# Patient Record
Sex: Female | Born: 1975
Health system: Southern US, Community
[De-identification: ages and names within clinical notes are randomized; demographics above are authoritative.]

## PROBLEM LIST (undated history)

## (undated) DIAGNOSIS — E079 Disorder of thyroid, unspecified: Secondary | ICD-10-CM

## (undated) DIAGNOSIS — T7840XA Allergy, unspecified, initial encounter: Secondary | ICD-10-CM

## (undated) DIAGNOSIS — F329 Major depressive disorder, single episode, unspecified: Secondary | ICD-10-CM

## (undated) DIAGNOSIS — F32A Depression, unspecified: Secondary | ICD-10-CM

## (undated) DIAGNOSIS — F419 Anxiety disorder, unspecified: Secondary | ICD-10-CM

## (undated) HISTORY — DX: Allergy, unspecified, initial encounter: T78.40XA

## (undated) HISTORY — DX: Disorder of thyroid, unspecified: E07.9

---

## 2007-12-22 ENCOUNTER — Other Ambulatory Visit: Admission: RE | Admit: 2007-12-22 | Discharge: 2007-12-22 | Payer: Self-pay | Admitting: Family Medicine

## 2009-02-14 ENCOUNTER — Other Ambulatory Visit: Admission: RE | Admit: 2009-02-14 | Discharge: 2009-02-14 | Payer: Self-pay | Admitting: Family Medicine

## 2009-10-25 ENCOUNTER — Ambulatory Visit: Payer: Self-pay | Admitting: Family Medicine

## 2009-10-25 DIAGNOSIS — F329 Major depressive disorder, single episode, unspecified: Secondary | ICD-10-CM | POA: Insufficient documentation

## 2009-10-25 DIAGNOSIS — E785 Hyperlipidemia, unspecified: Secondary | ICD-10-CM | POA: Insufficient documentation

## 2009-10-25 DIAGNOSIS — H1045 Other chronic allergic conjunctivitis: Secondary | ICD-10-CM | POA: Insufficient documentation

## 2009-10-25 DIAGNOSIS — E039 Hypothyroidism, unspecified: Secondary | ICD-10-CM | POA: Insufficient documentation

## 2009-11-12 DIAGNOSIS — L408 Other psoriasis: Secondary | ICD-10-CM | POA: Insufficient documentation

## 2009-11-12 DIAGNOSIS — E063 Autoimmune thyroiditis: Secondary | ICD-10-CM | POA: Insufficient documentation

## 2010-01-24 ENCOUNTER — Telehealth: Payer: Self-pay | Admitting: Family Medicine

## 2010-04-15 ENCOUNTER — Ambulatory Visit: Payer: Self-pay | Admitting: Family Medicine

## 2010-04-15 ENCOUNTER — Other Ambulatory Visit: Admission: RE | Admit: 2010-04-15 | Discharge: 2010-04-15 | Payer: Self-pay | Admitting: Family Medicine

## 2010-04-16 LAB — CONVERTED CEMR LAB
AST: 14 units/L (ref 0–37)
BUN: 15 mg/dL (ref 6–23)
CO2: 24 meq/L (ref 19–32)
Calcium: 9.4 mg/dL (ref 8.4–10.5)
Chloride: 105 meq/L (ref 96–112)
Cholesterol: 266 mg/dL — ABNORMAL HIGH (ref 0–200)
Creatinine, Ser: 0.66 mg/dL (ref 0.40–1.20)
Glucose, Bld: 87 mg/dL (ref 70–99)
HDL: 75 mg/dL (ref >39)
Total CHOL/HDL Ratio: 3.5
Triglycerides: 174 mg/dL — ABNORMAL HIGH (ref <150)

## 2010-08-20 NOTE — Assessment & Plan Note (Signed)
Summary: NOV: Allergic rhinitis, thyroid, etc   Vital Signs:  Patient profile:   35 year old female Height:      62 inches Weight:      258 pounds BMI:     47.36 O2 Sat:      98 % on Room air Temp:     98.7 degrees F oral Pulse rate:   92 / minute BP sitting:   124 / 87  (left arm) Cuff size:   large  Vitals Entered By: Kathlene November (October 25, 2009 10:32 AM)  O2 Flow:  Room air CC: Np- allergies- cough, head congestion, H/A, eyes watery, sneezing Is Patient Diabetic? No   CC:  Np- allergies- cough, head congestion, H/A, eyes watery, and sneezing.  History of Present Illness: Np- allergies- cough, head congestion, H/A, eyes watery, sneezing.     Hx of spring allergies.  Lasted a couple of months last year. Tried claritin D and didn't really work.  Now taking the zyrtec - D and feels helping some. Also taking mucinex DM.  Cough is productive in the AM.  NO ear pain.    Says she is overdue for a thyroid check. Used to see endocrinology but her insurance has changed.  Feels she is doing well and is asymptomatic at this time.   Habits & Providers  Alcohol-Tobacco-Diet     Alcohol drinks/day: 0     Tobacco Status: never  Exercise-Depression-Behavior     Does Patient Exercise: no     Have you felt down or hopeless? yes     STD Risk: never     Drug Use: no     Seat Belt Use: always  Current Medications (verified): 1)  Sertraline Hcl 50 Mg Tabs (Sertraline Hcl) .... Take 1 1/2 Tablets By Mouth Once A Day 2)  Synthroid 100 Mcg Tabs (Levothyroxine Sodium) .... Take One Tablet By Mouth Once A Day 3)  Ortho Tri-Cyclen (28) 0.18/0.215/0.25 Mg-35 Mcg Tabs (Norgestim-Eth Estrad Triphasic) .... Take One Tablet By Mouth Once A Day  Allergies (verified): No Known Drug Allergies  Comments:  Nurse/Medical Assistant: The patient's medications and allergies were reviewed with the patient and were updated in the Medication and Allergy Lists. 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   Never Smoked Alcohol use-no Drug use-no Regular exercise-no 2 caffeinated drinks per day.  Smoking Status:  never Does Patient Exercise:  no STD Risk:  never Drug Use:  no Seat Belt Use:  always  Review of Systems       No fever/sweats/weakness, unexplained weight loss/gain.  No vison changes.  No difficulty hearing/ringing in ears, + hay fever/allergies.  No chest pain/discomfort, palpitations.  No Br lump/nipple discharge.  + cough/wheeze.  No blood in BM, nausea/vomiting/diarrhea.  No nighttime urination, leaking urine, unusual vaginal bleeding, discharge (penis or vagina).  No muscle/joint pain. No rash, change in mole.  No HA, memory loss.  No anxiety, sleep d/o, depression.  No easy bruising/bleeding, unexplained lump   Physical Exam  General:  Well-developed,well-nourished,in no acute distress; alert,appropriate and cooperative throughout examination Head:  Normocephalic and atraumatic without obvious abnormalities. No apparent alopecia or balding. Eyes:  No corneal or conjunctival inflammation noted. EOMI. Perrla. Ears:  External ear exam shows no significant lesions or deformities.  Otoscopic examination  reveals clear canals, tympanic membranes are intact bilaterally without bulging, retraction, inflammation or discharge. Hearing is grossly normal bilaterally. Nose:  External nasal examination shows no deformity or inflammation. Nasal mucosa are pink and moist without lesions or exudates. Mouth:  Oral mucosa and oropharynx without lesions or exudates.  Teeth in good repair. Neck:  No deformities, masses, or tenderness noted. Chest Wall:  No deformities, masses, or tenderness noted. Lungs:  Normal respiratory effort, chest expands symmetrically. Lungs are clear to auscultation, no crackles or  wheezes. Heart:  Normal rate and regular rhythm. S1 and S2 normal without gallop, murmur, click, rub or other extra sounds. Skin:  no rashes.   Cervical Nodes:  No lymphadenopathy noted Psych:  Cognition and judgment appear intact. Alert and cooperative with normal attention span and concentration. No apparent delusions, illusions, hallucinations   Impression & Recommendations:  Problem # 1:  ALLERGIC CONJUNCTIVITIS (ICD-372.14) Dsicussed traetment with oral antihistamine and will add a nasal steroid. Instructed on spray use and correct technique.  .F/U in 3-4 weeks if not better.   Problem # 2:  UNSPECIFIED HYPOTHYROIDISM (ICD-244.9)  Due for thyroid recheck. Will check today. She ison branded synthroid.  Her updated medication list for this problem includes:    Synthroid 100 Mcg Tabs (Levothyroxine sodium) .Marland Kitchen... Take one tablet by mouth once a day  Orders: T-TSH ([id number])  Problem # 3:  HYPERLIPIDEMIA (ICD-272.4) Due to recheck as well.   Orders: T-Comprehensive Metabolic Panel [id number]) T-Lipid Profile [id number])  Complete Medication List: 1)  Sertraline Hcl 100 Mg Tabs (Sertraline hcl) .... Take 1 tablet by mouth once a day 2)  Synthroid 100 Mcg Tabs (Levothyroxine sodium) .... Take one tablet by mouth once a day 3)  Ortho Tri-cyclen (28) 0.18/0.215/0.25 Mg-35 Mcg Tabs (Norgestim-eth estrad triphasic) .... Take one tablet by mouth once a day 4)  Vectical 3 Mcg/gm Oint (Calcitriol) .... Apply bid  a day to affected area. 5)  Flonase 50 Mcg/act Susp (Fluticasone propionate) .... 2 sprays in each nostril once a day.  Patient Instructions: 1)  Call if not better in 2-3 weeks if your allergies are better.  Prescriptions: SERTRALINE HCL 100 MG TABS (SERTRALINE HCL) Take 1 tablet by mouth once a day  #30 x 3   Entered and Authorized by:   Nani Gasser MD   Signed by:   Nani Gasser MD on 10/25/2009   Method used:   [facility] to        [facility] [phone]* (retail)       [address] [place], [place]  [address]       Ph: [phone] or [phone]       Fax: [phone]   RxID:   [id number] FLONASE 50 MCG/ACT SUSP (FLUTICASONE PROPIONATE) 2 sprays in each nostril once a day.  #1 bottle x 2   Entered and Authorized by:   Nani Gasser MD   Signed by:   Nani Gasser MD on 10/25/2009   Method used:   Electronically to        [facility] [phone]* (retail)       [address] [place], [place]  [address]       Ph: [phone] or [phone]       Fax: [phone]   RxID:   [id number]   Appended Document: NOV: Allergic rhinitis, thyroid, etc   Appended Document: NOV: Allergic rhinitis, thyroid, etc  PAP Result Date:  02/14/2009 PAP Result:  normal PAP Next Due:  1 yr

## 2010-08-20 NOTE — Assessment & Plan Note (Signed)
Summary: CPE w/ pap   Vital Signs:  Patient profile:   35 year old female Height:      62 inches Weight:      256 pounds Pulse rate:   70 / minute BP sitting:   107 / 74  (right arm) Cuff size:   large  Vitals Entered By: Avon Gully CMA, Duncan Dull) (April 15, 2010 4:04 PM) CC: CPE and PAP   CC:  CPE and PAP.  History of Present Illness: Last pap was nromal.  Periods have been regular. Usually has diarrhea with her periods.  Occ wil notice some loose stools without her periods.  No blood in the stool.  Notices it may occur when she eats tomatoes. It always resolves after 1-2 days. No family hx of GI d/o.   Current Medications (verified): 1)  Sertraline Hcl 100 Mg Tabs (Sertraline Hcl) .... Take 1 Tablet By Mouth Once A Day 2)  Synthroid 100 Mcg Tabs (Levothyroxine Sodium) .... Take One Tablet By Mouth Once A Day 3)  Ortho Tri-Cyclen (28) 0.18/0.215/0.25 Mg-35 Mcg Tabs (Norgestim-Eth Estrad Triphasic) .... Take One Tablet By Mouth Once A Day 4)  Vectical 3 Mcg/gm Oint (Calcitriol) .... Apply Bid  A Day To Affected Area. 5)  Flonase 50 Mcg/act Susp (Fluticasone Propionate) .... 2 Sprays in Each Nostril Once A Day. 6)  Clobetasol Propionate 0.05 % Crea (Clobetasol Propionate) 7)  Desonide 0.05 % Crea (Desonide) .... Apply To Affected Area  Allergies (verified): No Known Drug Allergies  Comments:  Nurse/Medical Assistant: The patient's medications and allergies were reviewed with the patient and were updated in the Medication and Allergy Lists. 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   Never Smoked Alcohol use-no Drug use-no Regular exercise-no 2 caffeinated drinks per day.   Past Surgical  History: None  Contraindications/Deferment of Procedures/Staging:    Test/Procedure: FLU VAX    Reason for deferment: patient declined   Social History: Reviewed history from 10/25/2009 and no changes required. 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   Never Smoked Alcohol use-no Drug use-no Regular exercise-no 2 caffeinated drinks per day.   Review of Systems  The patient denies anorexia, fever, weight loss, weight gain, vision loss, decreased hearing, hoarseness, chest pain, syncope, dyspnea on exertion, peripheral edema, prolonged cough, headaches, hemoptysis, abdominal pain, melena, hematochezia, severe indigestion/heartburn, hematuria, incontinence, genital sores, muscle weakness, suspicious skin lesions, transient blindness, difficulty walking, depression, unusual weight change, abnormal bleeding, enlarged lymph nodes, and breast masses.    Physical Exam  General:  Well-developed,well-nourished,in no acute distress; alert,appropriate and cooperative throughout examination Head:  Normocephalic and atraumatic without obvious abnormalities. No apparent alopecia or balding. Eyes:  No corneal or conjunctival inflammation noted. EOMI. Perrla. Ears:  External ear exam shows no significant lesions or deformities.  Otoscopic examination reveals clear canals, tympanic membranes are intact bilaterally without bulging, retraction, inflammation or discharge. Hearing is grossly normal bilaterally. Nose:  External nasal examination shows no deformity or inflammation. Nasal mucosa are pink and moist without lesions or exudates. Mouth:  Oral mucosa and oropharynx without lesions or exudates.  Teeth in good repair. Neck:  No deformities, masses, or tenderness noted. Chest Wall:  No deformities,  masses, or tenderness noted. Breasts:  No mass, nodules, thickening, tenderness, bulging, retraction, inflamation, nipple discharge or skin changes noted.   Lungs:   Normal respiratory effort, chest expands symmetrically. Lungs are clear to auscultation, no crackles or wheezes. Heart:  Normal rate and regular rhythm. S1 and S2 normal without gallop, murmur, click, rub or other extra sounds. Abdomen:  Bowel sounds positive,abdomen soft and non-tender without masses, organomegaly or hernias noted. Genitalia:  Normal introitus for age, no external lesions, no vaginal discharge, mucosa pink and moist, no vaginal or cervical lesions, no vaginal atrophy, no friaility or hemorrhage, normal uterus size and position, no adnexal masses or tenderness Msk:  No deformity or scoliosis noted of thoracic or lumbar spine.   Pulses:  R and L carotid,radial,dorsalis pedis and posterior tibial pulses are full and equal bilaterally Extremities:  No clubbing, cyanosis, edema, or deformity noted with normal full range of motion of all joints.   Neurologic:  No cranial nerve deficits noted. Station and gait are normal.  Sensory, motor and coordinative functions appear intact. Skin:  no rashes.   Cervical Nodes:  No lymphadenopathy noted Axillary Nodes:  No palpable lymphadenopathy Psych:  Cognition and judgment appear intact. Alert and cooperative with normal attention span and concentration. No apparent delusions, illusions, hallucinations   Impression & Recommendations:  Problem # 1:  ROUTINE GYNECOLOGICAL EXAMINATION (ICD-V72.31) Exam is normal F/U pap results.  Gave reassurnce about stools. Avoid tomatoes if a trigger. If blood in stool or sxs worsens then call as may need GI referrral but ai don't here anything worisome by her history and no sxs consistant with infection Encouraged regular exercise and calcium intake.  Encourage TDAP but she wanted to hold off.   Complete Medication List: 1)  Sertraline Hcl 100 Mg Tabs (Sertraline hcl) .... Take 1 tablet by mouth once a day 2)  Synthroid 100 Mcg Tabs (Levothyroxine sodium) .... Take one tablet by mouth once a day 3)   Ortho Tri-cyclen (28) 0.18/0.215/0.25 Mg-35 Mcg Tabs (Norgestim-eth estrad triphasic) .... Take one tablet by mouth once a day 4)  Vectical 3 Mcg/gm Oint (Calcitriol) .... Apply bid  a day to affected area. 5)  Flonase 50 Mcg/act Susp (Fluticasone propionate) .... 2 sprays in each nostril once a day. 6)  Clobetasol Propionate 0.05 % Crea (Clobetasol propionate) .... Apply daily to affected area. 7)  Desonide 0.05 % Crea (Desonide) .... Apply to affected area 8)  Luxiq 0.12 % Foam (Betamethasone valerate) .... Apply daily to affected area on gthe scalp  Other Orders: T-TSH ([id number]) T-Comprehensive Metabolic Panel [id number] [id number]) T-Lipid Profile [id number])  Patient Instructions: 1)  We will call you with your lab results by the end of the week 2)  Think about getting your tetanus vaccine.  Prescriptions: LUXIQ 0.12 % FOAM (BETAMETHASONE VALERATE) Apply daily to affected area on gthe scalp  #1 can x 1   Entered and Authorized by:   Nani Gasser MD   Signed by:   Nani Gasser MD on 04/15/2010   Method used:   [facility]ronically to        [facility] [phone]* (retail)       [address] [place], [place]  [address]       Ph: [phone] or [phone]       Fax: [phone]   RxID:   [id number] CLOBETASOL PROPIONATE 0.05 % CREA (CLOBETASOL PROPIONATE) Apply daily to affected area.  #40 grams. x 1  Entered and Authorized by:   Nani Gasser MD   Signed by:   Nani Gasser MD on 04/15/2010   Method used:   Electronically to        [facility] [phone]* (retail)       [address] [place], [place]  [address]       Ph: [phone] or [phone]       Fax: [phone]   RxID:   [id number] DESONIDE 0.05 % CREA (DESONIDE) apply to affected area  #40 gram. x 1   Entered and Authorized by:   Nani Gasser MD   Signed by:   Nani Gasser MD on 04/15/2010   Method used:   Electronically to        [facility] [phone]* (retail)        [address] [place], [place]  [address]       Ph: [phone] or [phone]       Fax: [phone]   RxID:   [id number] FLONASE 50 MCG/ACT SUSP (FLUTICASONE PROPIONATE) 2 sprays in each nostril once a day.  #1 bottle x 2   Entered and Authorized by:   Nani Gasser MD   Signed by:   Nani Gasser MD on 04/15/2010   Method used:   Electronically to        [facility] [phone]* (retail)       [address] [place], [place]  [address]       Ph: [phone] or [phone]       Fax: [phone]   RxID:   [id number] ORTHO TRI-CYCLEN (28) 0.18/0.215/0.25 MG-35 MCG TABS (NORGESTIM-ETH ESTRAD TRIPHASIC) Take one tablet by mouth once a day  #28 x 11   Entered and Authorized by:   Nani Gasser MD   Signed by:   Nani Gasser MD on 04/15/2010   Method used:   Electronically to        [facility] [phone]* (retail)       [address] [place], [place]  [address]       Ph: [phone] or [phone]       Fax: [phone]   RxID:   [id number]   Appended Document: CPE w/ pap Call pt: Pap smear normal- Return for  Pap in 3 years .  HPV neg.  [place]etheney MD, Santina Evans  04/22/2010 @ 4:49pm- Pt notified of results via VM. KJ LPN

## 2010-08-20 NOTE — Letter (Signed)
Summary: Records Dated 10-23-84 thru 06-13-09/[facility]  Records Dated 10-23-84 thru 06-13-09/[facility]   Imported By: Lanelle Bal 11/15/2009 12:15:38  _____________________________________________________________________  External Attachment:    Type:   Image     Comment:   External Document

## 2010-08-20 NOTE — Progress Notes (Signed)
Summary: Call A Nurse  Phone Note Call from Patient   Caller: Call A Nurse Summary of Call: [facility] Triage Call Report Triage Record Num: [id number] Operator: Tomasita Crumble Patient Name: [name] Call Date & Time: 01/19/2010 6:33:41PM Patient Phone: [phone] PCP: Nani Gasser Patient Gender: Female PCP Fax : [phone] Patient DOB: 11/02/1975 Practice Name: [facility] Reason for Call: Pt. calling. States she is 1-2 weeks before menses; cramping "like a period." Pain rated at 5/10. Onset 7/2 am. Clumpy/ cloudy urine noted. Afebrile/subjective. States pain constant x 3 or more hours; interferes w/ activity. Advised ED per Abdominal Pain protocol. Caller uncertain which facilty she will go to; reinfoeced need to go to ED. Protocol(s) Used: Abdominal Pain / Discomfort Recommended Outcome per Protocol: See ED Immediately Reason for Outcome: Abdominal pain that has steadily worsened over hours OR has been continuous for 3 hours or more AND any of the following: loss of appetite, vomiting starting after pain, any fever, OR unable to carry out normal activities Care Advice:  ~ 07 Initial call taken by: Payton Spark CMA,  January 24, 2010 8:32 AM     Appended Document: Call A Nurse Pls call and see if pt went to the ED.  Seymour Bars, D.O.  Appended Document: Call A Nurse [facility] for Pt to CB

## 2010-10-15 ENCOUNTER — Other Ambulatory Visit: Payer: Self-pay | Admitting: *Deleted

## 2010-10-15 DIAGNOSIS — E039 Hypothyroidism, unspecified: Secondary | ICD-10-CM

## 2010-10-15 MED ORDER — LEVOTHYROXINE SODIUM 112 MCG PO TABS: 112.0000 ug | ORAL_TABLET | Freq: Every day | ORAL | Status: DC

## 2010-10-30 ENCOUNTER — Telehealth: Payer: Self-pay | Admitting: *Deleted

## 2010-10-30 MED ORDER — FLUTICASONE FUROATE 27.5 MCG/SPRAY NA SUSP: 2.0000 | Freq: Every day | NASAL | Status: DC

## 2010-10-30 NOTE — Telephone Encounter (Signed)
We can try changing to veraymyst. I think we have coupons off the copay if she wants to pick one up.

## 2010-10-30 NOTE — Telephone Encounter (Signed)
Pt.notified

## 2010-10-30 NOTE — Telephone Encounter (Signed)
Pt called and states she has been using flonase but she thinks she needs something stronger because its not working for her.Pt also uses Zyrtec DIs there another nasal spray that may work better?.[facility]  [address] [place].

## 2010-11-01 ENCOUNTER — Encounter: Payer: Self-pay | Admitting: Family Medicine

## 2010-11-07 ENCOUNTER — Encounter: Payer: Self-pay | Admitting: Family Medicine

## 2010-11-08 ENCOUNTER — Encounter: Payer: Self-pay | Admitting: Family Medicine

## 2010-11-08 ENCOUNTER — Ambulatory Visit (INDEPENDENT_AMBULATORY_CARE_PROVIDER_SITE_OTHER): Payer: Private Health Insurance - Indemnity | Admitting: Family Medicine

## 2010-11-08 DIAGNOSIS — E785 Hyperlipidemia, unspecified: Secondary | ICD-10-CM

## 2010-11-08 DIAGNOSIS — E7889 Other lipoprotein metabolism disorders: Secondary | ICD-10-CM

## 2010-11-08 DIAGNOSIS — E789 Disorder of lipoprotein metabolism, unspecified: Secondary | ICD-10-CM

## 2010-11-08 DIAGNOSIS — E039 Hypothyroidism, unspecified: Secondary | ICD-10-CM

## 2010-11-08 MED ORDER — SERTRALINE HCL 100 MG PO TABS: ORAL_TABLET | ORAL | Status: DC

## 2010-11-08 NOTE — Patient Instructions (Addendum)
WE will call you with your lab results.

## 2010-11-08 NOTE — Assessment & Plan Note (Signed)
REviewd her last labs and gave her a copy. Due to recheck Discussed that we would not start a chol med but very important to get this down. Will refer for nutrition counseling.

## 2010-11-08 NOTE — Progress Notes (Signed)
  Subjective:    Patient ID: Monique Austin, female    DOB: 03-05-1976, 35 y.o.   MRN: [id number]  HPI  She is her to f/u her thyroid. WE adjusted her dose last time.  She denies any sx of skin, hair changes or sweats or chills or fatigue. She is struggling with eating a healthy diet and getting regular exercise She would like to see a nutritionist.   Review of Systems     Objective:   Physical Exam  Constitutional: She is oriented to person, place, and time. She appears well-developed and well-nourished.  HENT:  Head: Normocephalic.  Neck: No thyromegaly present.  Cardiovascular: Normal rate, regular rhythm and normal heart sounds.   Pulmonary/Chest: Effort normal and breath sounds normal.  Neurological: She is alert and oriented to person, place, and time.  Skin: Skin is warm and dry.  Psychiatric: She has a normal mood and affect.          Assessment & Plan:

## 2010-11-08 NOTE — Assessment & Plan Note (Signed)
Asymptomatic. Due to recheck levels.

## 2010-12-11 ENCOUNTER — Other Ambulatory Visit: Payer: Self-pay | Admitting: Family Medicine

## 2011-02-12 ENCOUNTER — Ambulatory Visit: Payer: Private Health Insurance - Indemnity | Admitting: *Deleted

## 2011-04-08 ENCOUNTER — Other Ambulatory Visit: Payer: Self-pay | Admitting: Family Medicine

## 2011-04-08 MED ORDER — NORGESTIM-ETH ESTRAD TRIPHASIC 0.18/0.215/0.25 MG-35 MCG PO TABS: 1.0000 | ORAL_TABLET | Freq: Every day | ORAL | Status: DC

## 2011-04-08 NOTE — Telephone Encounter (Signed)
Pt called for refill of her orthotricyclen medication. Plan:  Reviewed the pt chart and she is due this month for CPE/PAP.  LMOM for the pt telling her that a 30 day supply of her med will be sent but she has to get in before any more refills will be sent after today. Jarvis Newcomer, LPN [facility] Dimes

## 2011-04-15 ENCOUNTER — Encounter: Payer: Self-pay | Admitting: Family Medicine

## 2011-04-21 ENCOUNTER — Telehealth: Payer: Self-pay | Admitting: Family Medicine

## 2011-04-21 ENCOUNTER — Encounter: Payer: Self-pay | Admitting: Family Medicine

## 2011-04-21 ENCOUNTER — Ambulatory Visit (INDEPENDENT_AMBULATORY_CARE_PROVIDER_SITE_OTHER): Payer: Managed Care, Other (non HMO) | Admitting: Family Medicine

## 2011-04-21 VITALS — BP 124/78 | HR 85 | Wt 262.0 lb

## 2011-04-21 DIAGNOSIS — R0789 Other chest pain: Secondary | ICD-10-CM

## 2011-04-21 DIAGNOSIS — F411 Generalized anxiety disorder: Secondary | ICD-10-CM

## 2011-04-21 DIAGNOSIS — F419 Anxiety disorder, unspecified: Secondary | ICD-10-CM

## 2011-04-21 DIAGNOSIS — R071 Chest pain on breathing: Secondary | ICD-10-CM

## 2011-04-21 MED ORDER — FLUOXETINE HCL (PMDD) 20 MG PO CAPS: 20.0000 mg | ORAL_CAPSULE | Freq: Every day | ORAL | Status: DC

## 2011-04-21 NOTE — Patient Instructions (Addendum)
  Costochondritis (Costochondral Separation / Tietze Syndrome) Costochondritis (Tietze syndrome), or costochondral separation, is a swelling and irritation (inflammation) of the tissue (cartilage) that connects your ribs with your breastbone (sternum). It may occur on its own (spontaneously), through damage caused by an accident (trauma), or simply from coughing or minor exercise. It may take up to 6 weeks to get better and longer if you are unable to be conservative in your activities. HOME CARE INSTRUCTIONS  Avoid exhausting physical activity. Try not to strain your ribs during normal activity. This would include any activities using chest, belly (abdominal) and side muscles, especially if heavy weights are used.   Use ice for 5-10 minutes per hour while awake for the first 2 days. Place the ice in a plastic bag, and place a towel between the bag of ice and your skin.   Only take over-the-counter or prescription medicines for pain, discomfort, or fever as directed by your caregiver.  SEEK IMMEDIATE MEDICAL CARE IF:  Your pain increases or you are very uncomfortable.   An oral temperature above 101 develops.   You develop difficulty with your breathing.   You cough up blood.   You develop worse chest pains, shortness of breath, sweating, or vomiting.   You develop new, unexplained problems (symptoms).  MAKE SURE YOU:    Understand these instructions.   Will watch your condition.   Will get help right away if you are not doing well or get worse.  Document Released: 04/16/2005 Document Re-Released: 10/01/2009 [facility] Patient Information 2011 [place], [place].      Drop sertraline to 1 tab daily for 7 days then half tab daily for 7 days. Then quarter tab daily for 7 days. Once get to quarter tab then can start fluoxetine 20mg  daily.

## 2011-04-21 NOTE — Progress Notes (Signed)
Subjective:    Patient ID: Monique Austin, female    DOB: [birth year], 35 y.o.   MRN: [id number]  HPI Starting last Thursday has had upper chest pain that rotates sides. Stopped her thyroid and OCP about a week ago because she ran out of money to get her meds. Pain on the right side of the neck. No neck pain. Restarted her meds on thurs or Friday. No known trauma or injury.  No inc pain with rotation of the neck  Or upper arm. Some mild diarrhea on and off. Some SOb when got upset but not persistant. Has Pain on the right side.  No cough.  She also reports that her anxiety has been out of control. She's been under a lot of stress the last couple weeks it. She says unfortunately these are things that will continue to persist for a while. She is also struggling financially which was reason she didn't get her medications filled. She says she actually increased her sertraline to 2 tabs instead of one and a half tab on her own. But she's not sure this really helped with her mood or not.   Review of Systems     BP 124/78  Pulse 85  Wt 262 lb (118.842 kg)    No Known Allergies  Past Medical History  Diagnosis Date  . Thyroid disease   . Allergy     History reviewed. No pertinent past surgical history.  History   Social History  . Marital Status: Single    Spouse Name: N/A    Number of Children: N/A  . Years of Education: N/A   Occupational History  . Not on file.   Social History Main Topics  . Smoking status: Never Smoker   . Smokeless tobacco: Not on file  . Alcohol Use: No  . Drug Use: No  . Sexually Active:    Other Topics Concern  . Not on file   Social History Narrative  . No narrative on file    Family History  Problem Relation Age of Onset  . Cancer Mother 56    postmenopausal breast   . Hyperlipidemia Father   . Hypertension Father     Ms. [name] does not currently have medications on file.  Objective:   Physical Exam  Constitutional: She  appears well-developed and well-nourished.  HENT:  Head: Normocephalic and atraumatic.  Neck: Neck supple. No thyromegaly present.  Cardiovascular: Normal rate, regular rhythm and normal heart sounds.        No carotid bruits.   Pulmonary/Chest: Effort normal and breath sounds normal.       Mildly tender over the right upper chest post right below her clavicle. She is to have a little bit of tenderness on the left side as well. No tenderness over the sternum.  Lymphadenopathy:    She has no cervical adenopathy.  Skin: Skin is warm and dry.  Psychiatric: She has a normal mood and affect. Her behavior is normal.          Assessment & Plan:  Costochondritis - Recommend NSAID.  Warm compresses/heating pad.  I gave her handout on some information about this. If she is not feeling better in about a week please give [place] a call. i did do an EKG just to rule out cardiac cause as she is here worried about her heart. She has had some shortness of breath it is only when she gets very anxious. No palpitations. Her EKG shows 80 beats per minute,  normal sinus rhythm, she does have inverted T waves in lead 3 and aVF. No other acute abnormalities. I gave her some reassurance. She also is low risk overall for heart disease, except for her cholesterol and obesity.  Anxiety-anxiety is out of control right now. Her GAD 7 score is 21. I would like to discontinue the sertraline which he has been on for years and change her to fluoxetine. Certainly the only medication she's ever taken for her mood. I feel it is not controlling her symptoms very well right now. We also discussed the possibility of leaving the sertraline and adding Wellbutrin but at this point I would like to try changing medications. I would weaning process for her.  I'll see her back in 5-6 weeks. She actually has a physical planned later this week I'll see her then as well.

## 2011-04-21 NOTE — Telephone Encounter (Signed)
Pt dropped by the office this PM to have form filled out by the provider for loss time of work due to chest pain, SOB, diarrhea.  Under lots of stress.  Pt has sched appt our office on Wednesday. Plan:  Pt sent to [facility] but pt came back stating UC said need to see our office.  Dr. Linford Arnold will see. Jarvis Newcomer, LPN [facility]

## 2011-04-22 ENCOUNTER — Encounter: Payer: Self-pay | Admitting: Family Medicine

## 2011-04-23 ENCOUNTER — Ambulatory Visit (INDEPENDENT_AMBULATORY_CARE_PROVIDER_SITE_OTHER): Payer: Managed Care, Other (non HMO) | Admitting: Family Medicine

## 2011-04-23 ENCOUNTER — Encounter: Payer: Self-pay | Admitting: Family Medicine

## 2011-04-23 VITALS — BP 135/75 | HR 78 | Ht 62.0 in | Wt 261.0 lb

## 2011-04-23 DIAGNOSIS — E039 Hypothyroidism, unspecified: Secondary | ICD-10-CM

## 2011-04-23 DIAGNOSIS — Z0000 Encounter for general adult medical examination without abnormal findings: Secondary | ICD-10-CM

## 2011-04-23 NOTE — Progress Notes (Signed)
  Subjective:     Elspeth [name] is a 35 y.o. female and is here for a comprehensive physical exam. The patient reports no problems.  History   Social History  . Marital Status: Single    Spouse Name: N/A    Number of Children: N/A  . Years of Education: N/A   Occupational History  . Not on file.   Social History Main Topics  . Smoking status: Never Smoker   . Smokeless tobacco: Not on file  . Alcohol Use: No  . Drug Use: No  . Sexually Active:    Other Topics Concern  . Not on file   Social History Narrative  . No narrative on file   Health Maintenance  Topic Date Due  . Influenza Vaccine  04/20/2012  . Pap Smear  04/15/2013  . Tetanus/tdap  04/22/2021    The following portions of the patient's history were reviewed and updated as appropriate: allergies, current medications, past family history, past medical history, past social history and past surgical history.  Review of Systems A comprehensive review of systems was negative.   Objective:    BP 135/75  Pulse 78  Ht 5\' 2"  (1.575 m)  Wt 261 lb (118.389 kg)  BMI 47.74 kg/m2  SpO2 99%  LMP 04/02/2011 General appearance: alert, cooperative, appears stated age and mildly obese Head: Normocephalic, without obvious abnormality, atraumatic Eyes: Conjunctiva clear, extraocular movements intact, pupils equal round reactive to light and accommodation. Ears: normal TM's and external ear canals both ears Nose: Nares normal. Septum midline. Mucosa normal. No drainage or sinus tenderness. Throat: lips, mucosa, and tongue normal; teeth and gums normal Neck: no adenopathy, no carotid bruit, supple, symmetrical, trachea midline and thyroid not enlarged, symmetric, no tenderness/mass/nodules Back: symmetric, no curvature. ROM normal. No CVA tenderness. Lungs: clear to auscultation bilaterally Breasts: normal appearance, no masses or tenderness Heart: regular rate and rhythm, S1, S2 normal, no murmur, click, rub or  gallop Abdomen: soft, non-tender; bowel sounds normal; no masses,  no organomegaly Pelvic: cervix normal in appearance, external genitalia normal, no adnexal masses or tenderness, no cervical motion tenderness, rectovaginal septum normal, uterus normal size, shape, and consistency and vagina normal without discharge Extremities: extremities normal, atraumatic, no cyanosis or edema Pulses: 2+ and symmetric Skin: Skin color, texture, turgor normal. No rashes or lesions Lymph nodes: Cervical, supraclavicular, and axillary nodes normal. Neurologic: Grossly normal    Assessment:    Healthy female exam.   Plan:     See After Visit Summary for Counseling Recommendations  Start a regular exercise program and make sure you are eating a healthy diet Try to eat 4 servings of dairy a day or take a calcium supplement (500mg  twice a day). Your vaccines are up to date.  Given lab slip.  We will call her with her Pap smear result. We did an HPV effect is normal we can repeat in 2-3 years.

## 2011-04-23 NOTE — Patient Instructions (Signed)
Start a regular exercise program and make sure you are eating a healthy diet Try to eat 4 servings of dairy a day or take a calcium supplement (500mg twice a day). Your vaccines are up to date.   

## 2011-04-24 ENCOUNTER — Other Ambulatory Visit (HOSPITAL_COMMUNITY)
Admission: RE | Admit: 2011-04-24 | Discharge: 2011-04-24 | Disposition: A | Discharge status: Home / Self Care | Payer: 59 | Source: Ambulatory Visit | Attending: Family Medicine | Admitting: Family Medicine

## 2011-04-24 DIAGNOSIS — Z01419 Encounter for gynecological examination (general) (routine) without abnormal findings: Secondary | ICD-10-CM | POA: Insufficient documentation

## 2011-04-24 NOTE — Progress Notes (Signed)
Addended by: Avon Gully C on: 04/24/2011 11:41 AM   Modules accepted: Orders

## 2011-05-15 ENCOUNTER — Other Ambulatory Visit: Payer: Self-pay | Admitting: Family Medicine

## 2011-05-26 ENCOUNTER — Other Ambulatory Visit: Payer: Self-pay | Admitting: Family Medicine

## 2011-05-28 ENCOUNTER — Ambulatory Visit: Payer: Managed Care, Other (non HMO) | Admitting: Family Medicine

## 2011-06-16 ENCOUNTER — Encounter: Payer: Self-pay | Admitting: Family Medicine

## 2011-06-16 ENCOUNTER — Ambulatory Visit (INDEPENDENT_AMBULATORY_CARE_PROVIDER_SITE_OTHER): Payer: Managed Care, Other (non HMO) | Admitting: Family Medicine

## 2011-06-16 VITALS — BP 113/76 | HR 78 | Wt 258.0 lb

## 2011-06-16 DIAGNOSIS — F411 Generalized anxiety disorder: Secondary | ICD-10-CM

## 2011-06-16 DIAGNOSIS — F419 Anxiety disorder, unspecified: Secondary | ICD-10-CM

## 2011-06-16 MED ORDER — NORGESTIM-ETH ESTRAD TRIPHASIC 0.18/0.215/0.25 MG-35 MCG PO TABS: 1.0000 | ORAL_TABLET | Freq: Every day | ORAL | Status: DC

## 2011-06-16 MED ORDER — FLUOXETINE HCL 40 MG PO CAPS: 40.0000 mg | ORAL_CAPSULE | Freq: Every day | ORAL | Status: DC

## 2011-06-16 MED ORDER — LEVOTHYROXINE SODIUM 112 MCG PO TABS: 112.0000 ug | ORAL_TABLET | Freq: Every day | ORAL | Status: DC

## 2011-06-16 NOTE — Progress Notes (Signed)
  Subjective:    Patient ID: Monique Austin, female    DOB: January 17, 1976, 35 y.o.   MRN: [id number]  HPI Here to f/u Anxiety. Says the medication is working some  But not nearly as well as the sertraline, but she was also on the max dose of hte medication. She also has a shopping addiction tht is causing some problems for her, financial and relationsihip. She is interested in counseling. She is tolerated Loxitane well without any side effects. She is not getting any regular exercise.  Review of Systems     Objective:   Physical Exam  Constitutional: She is oriented to person, place, and time. She appears well-developed and well-nourished.  HENT:  Head: Normocephalic and atraumatic.  Cardiovascular: Normal rate, regular rhythm and normal heart sounds.   Pulmonary/Chest: Effort normal and breath sounds normal.  Neurological: She is alert and oriented to person, place, and time.  Skin: Skin is warm and dry.  Psychiatric: She has a normal mood and affect. Her behavior is normal.          Assessment & Plan:  Anxiety - GAD-7 score of 21 today. Inc fluox to 40mg  and call in 2 weeks if wants to inc to 60mg  dose. F/u in 1 mo. be happy to refer her for counseling. I think it's fantastic that she wants to start to work with someone on her shopping addiction. If she can get this under control with certainly help her anxiety as well.

## 2011-06-17 ENCOUNTER — Other Ambulatory Visit: Payer: Self-pay | Admitting: *Deleted

## 2011-06-17 ENCOUNTER — Telehealth: Payer: Self-pay | Admitting: Family Medicine

## 2011-06-17 MED ORDER — NORGESTIM-ETH ESTRAD TRIPHASIC 0.18/0.215/0.25 MG-35 MCG PO TABS: 1.0000 | ORAL_TABLET | Freq: Every day | ORAL | Status: DC

## 2011-06-17 MED ORDER — LEVOTHYROXINE SODIUM 112 MCG PO TABS: 112.0000 ug | ORAL_TABLET | Freq: Every day | ORAL | Status: DC

## 2011-06-17 MED ORDER — FLUOXETINE HCL 40 MG PO CAPS: 40.0000 mg | ORAL_CAPSULE | Freq: Every day | ORAL | Status: DC

## 2011-06-17 MED ORDER — FLUTICASONE FUROATE 27.5 MCG/SPRAY NA SUSP: 2.0000 | Freq: Every day | NASAL | Status: DC

## 2011-06-17 NOTE — Progress Notes (Signed)
Addended by: Ellsworth Lennox on: 06/17/2011 04:59 PM   Modules accepted: Orders

## 2011-06-17 NOTE — Telephone Encounter (Signed)
Patient was seen yesterday and Dr. Linford Arnold wanted her to have 3 meds and they are not at her pharmacy. Will you please resend to [facility] [place] [address]

## 2011-07-01 ENCOUNTER — Encounter (HOSPITAL_COMMUNITY): Payer: Self-pay

## 2011-07-03 ENCOUNTER — Ambulatory Visit (HOSPITAL_COMMUNITY): Payer: 59 | Admitting: Psychology

## 2011-07-04 ENCOUNTER — Encounter: Payer: Self-pay | Admitting: Family Medicine

## 2011-07-04 ENCOUNTER — Ambulatory Visit (INDEPENDENT_AMBULATORY_CARE_PROVIDER_SITE_OTHER): Payer: 59 | Admitting: Family Medicine

## 2011-07-04 DIAGNOSIS — J069 Acute upper respiratory infection, unspecified: Secondary | ICD-10-CM

## 2011-07-04 NOTE — Progress Notes (Signed)
  Subjective:    Patient ID: Monique Austin, female    DOB: 06-27-1976, 35 y.o.   MRN: [id number]  HPI ST for 3 days.  Then woke up with phlegm in her throat. Runny nose and nasal congestion. + HA, aches and pain.  No fever.  Some mild ear pain. ST is better.  Using IBU and mucinex. Using netti-pot. Has missed 2 days of work. Boyfriend is now getting sick.    Review of Systems     Objective:   Physical Exam  Constitutional: She is oriented to person, place, and time. She appears well-developed and well-nourished.  HENT:  Head: Normocephalic and atraumatic.  Right Ear: External ear normal.  Left Ear: External ear normal.  Nose: Nose normal.  Mouth/Throat: Oropharynx is clear and moist.       TMs and canals are clear. Nose is red and irritated.    Eyes: Conjunctivae and EOM are normal. Pupils are equal, round, and reactive to light.  Neck: Neck supple. No thyromegaly present.  Cardiovascular: Normal rate, regular rhythm and normal heart sounds.   Pulmonary/Chest: Effort normal and breath sounds normal. She has no wheezes.  Lymphadenopathy:    She has no cervical adenopathy.  Neurological: She is alert and oriented to person, place, and time.  Skin: Skin is warm and dry.  Psychiatric: She has a normal mood and affect.          Assessment & Plan:  URI - Symptomatic care.  Given H.O. On proven treatments.  Call if not better in one week. Work note given. Lung exam is nl. Gave reasurrance.

## 2011-07-04 NOTE — Patient Instructions (Signed)
Call if not better in one week.  

## 2011-07-09 ENCOUNTER — Encounter: Payer: Self-pay | Admitting: Family Medicine

## 2011-07-17 ENCOUNTER — Ambulatory Visit: Payer: Managed Care, Other (non HMO) | Admitting: Family Medicine

## 2011-08-14 ENCOUNTER — Other Ambulatory Visit: Payer: Self-pay | Admitting: Family Medicine

## 2011-09-15 ENCOUNTER — Other Ambulatory Visit: Payer: Self-pay | Admitting: *Deleted

## 2011-09-15 MED ORDER — FLUOXETINE HCL 40 MG PO CAPS: 40.0000 mg | ORAL_CAPSULE | Freq: Every day | ORAL | Status: DC

## 2011-10-08 ENCOUNTER — Other Ambulatory Visit: Payer: Self-pay | Admitting: Family Medicine

## 2011-10-09 ENCOUNTER — Ambulatory Visit (INDEPENDENT_AMBULATORY_CARE_PROVIDER_SITE_OTHER): Payer: Managed Care, Other (non HMO) | Admitting: Family Medicine

## 2011-10-09 ENCOUNTER — Encounter: Payer: Self-pay | Admitting: Family Medicine

## 2011-10-09 VITALS — BP 128/89 | HR 88 | Temp 98.4°F | Ht 62.0 in | Wt 256.0 lb

## 2011-10-09 DIAGNOSIS — H9209 Otalgia, unspecified ear: Secondary | ICD-10-CM

## 2011-10-09 DIAGNOSIS — J329 Chronic sinusitis, unspecified: Secondary | ICD-10-CM

## 2011-10-09 MED ORDER — AMOXICILLIN-POT CLAVULANATE 875-125 MG PO TABS: 1.0000 | ORAL_TABLET | Freq: Two times a day (BID) | ORAL | Status: AC

## 2011-10-09 MED ORDER — NORGESTIM-ETH ESTRAD TRIPHASIC 0.18/0.215/0.25 MG-35 MCG PO TABS: 1.0000 | ORAL_TABLET | Freq: Every day | ORAL | Status: DC

## 2011-10-09 NOTE — Patient Instructions (Signed)
Sinusitis Sinuses are air pockets within the bones of your face. The growth of bacteria within a sinus leads to infection. The infection prevents the sinuses from draining. This infection is called sinusitis. SYMPTOMS   There will be different areas of pain depending on which sinuses have become infected.  The maxillary sinuses often produce pain beneath the eyes.   Frontal sinusitis may cause pain in the middle of the forehead and above the eyes.  Other problems (symptoms) include:  Toothaches.   Colored, pus-like (purulent) drainage from the nose.   Swelling, warmth, and tenderness over the sinus areas may be signs of infection.  TREATMENT   Sinusitis is most often determined by an exam.X-rays may be taken. If x-rays have been taken, make sure you obtain your results or find out how you are to obtain them. Your caregiver may give you medications (antibiotics). These are medications that will help kill the bacteria causing the infection. You may also be given a medication (decongestant) that helps to reduce sinus swelling.   HOME CARE INSTRUCTIONS    Only take over-the-counter or prescription medicines for pain, discomfort, or fever as directed by your caregiver.   Drink extra fluids. Fluids help thin the mucus so your sinuses can drain more easily.   Applying either moist heat or ice packs to the sinus areas may help relieve discomfort.   Use saline nasal sprays to help moisten your sinuses. The sprays can be found at your local drugstore.  SEEK IMMEDIATE MEDICAL CARE IF:  You have a fever.   You have increasing pain, severe headaches, or toothache.   You have nausea, vomiting, or drowsiness.   You develop unusual swelling around the face or trouble seeing.  MAKE SURE YOU:    Understand these instructions.   Will watch your condition.   Will get help right away if you are not doing well or get worse.  Document Released: 07/07/2005 Document Revised: 06/26/2011 Document  Reviewed: 02/03/2007 [facility] Patient Information 2012 [place], [place].Otalgia The most common reason for this in children is an infection of the middle ear. Pain from the middle ear is usually caused by a build-up of fluid and pressure behind the eardrum. Pain from an earache can be sharp, dull, or burning. The pain may be temporary or constant. The middle ear is connected to the nasal passages by a short narrow tube called the Eustachian tube. The Eustachian tube allows fluid to drain out of the middle ear, and helps keep the pressure in your ear equalized. CAUSES   A cold or allergy can block the Eustachian tube with inflammation and the build-up of secretions. This is especially likely in small children, because their Eustachian tube is shorter and more horizontal. When the Eustachian tube closes, the normal flow of fluid from the middle ear is stopped. Fluid can accumulate and cause stuffiness, pain, hearing loss, and an ear infection if germs start growing in this area. SYMPTOMS   The symptoms of an ear infection may include fever, ear pain, fussiness, increased crying, and irritability. Many children will have temporary and minor hearing loss during and right after an ear infection. Permanent hearing loss is rare, but the risk increases the more infections a child has. Other causes of ear pain include retained water in the outer ear canal from swimming and bathing. Ear pain in adults is less likely to be from an ear infection. Ear pain may be referred from other locations. Referred pain may be from the joint between  your jaw and the skull. It may also come from a tooth problem or problems in the neck. Other causes of ear pain include:  A foreign body in the ear.   Outer ear infection.   Sinus infections.   Impacted ear wax.   Ear injury.   Arthritis of the jaw or TMJ problems.   Middle ear infection.   Tooth infections.   Sore throat with pain to the ears.  DIAGNOSIS   Your  caregiver can usually make the diagnosis by examining you. Sometimes other special studies, including x-rays and lab work may be necessary. TREATMENT    If antibiotics were prescribed, use them as directed and finish them even if you or your child's symptoms seem to be improved.   Sometimes PE tubes are needed in children. These are little plastic tubes which are put into the eardrum during a simple surgical procedure. They allow fluid to drain easier and allow the pressure in the middle ear to equalize. This helps relieve the ear pain caused by pressure changes.  HOME CARE INSTRUCTIONS    Only take over-the-counter or prescription medicines for pain, discomfort, or fever as directed by your caregiver. DO NOT GIVE CHILDREN ASPIRIN because of the association of Reye's Syndrome in children taking aspirin.   Use a cold pack applied to the outer ear for 15 to 20 minutes, 3 to 4 times per day or as needed may reduce pain. Do not apply ice directly to the skin. You may cause frost bite.   Over-the-counter ear drops used as directed may be effective. Your caregiver may sometimes prescribe ear drops.   Resting in an upright position may help reduce pressure in the middle ear and relieve pain.   Ear pain caused by rapidly descending from high altitudes can be relieved by swallowing or chewing gum. Allowing infants to suck on a bottle during airplane travel can help.   Do not smoke in the house or near children. If you are unable to quit smoking, smoke outside.   Control allergies.  SEEK IMMEDIATE MEDICAL CARE IF:    You or your child are becoming sicker.   Pain or fever relief is not obtained with medicine.   You or your child's symptoms (pain, fever, or irritability) do not improve within 24 to 48 hours or as instructed.   Severe pain suddenly stops hurting. This may indicate a ruptured eardrum.   You or your children develop new problems such as severe headaches, stiff neck, difficulty  swallowing, or swelling of the face or around the ear.  Document Released: 02/22/2004 Document Revised: 06/26/2011 Document Reviewed: 06/28/2008 [facility] Patient Information 2012 [place], [place].

## 2011-10-09 NOTE — Progress Notes (Signed)
  Subjective:    Patient ID: Monique Austin, female    DOB: 01/15/1976, 36 y.o.   MRN: [id number]  HPI ST x 3 days. Nasal congestion.  Bilat ear and eye pain.  + HA.  Gets slightly better if takes Advil.  Runny nose.  Cough.  Yellow thick mucous.  Taking zyrtec -D.  Using her netti-pot.  No fever.  No GI sxs.  Nightsweats.     Review of Systems     Objective:   Physical Exam  Constitutional: She is oriented to person, place, and time. She appears well-developed and well-nourished.  HENT:  Head: Normocephalic and atraumatic.  Right Ear: External ear normal.  Left Ear: External ear normal.  Nose: Nose normal.  Mouth/Throat: Oropharynx is clear and moist.       TMs and canals are clear.   Eyes: Conjunctivae and EOM are normal. Pupils are equal, round, and reactive to light.  Neck: Neck supple. No thyromegaly present.  Cardiovascular: Normal rate, regular rhythm and normal heart sounds.   Pulmonary/Chest: Effort normal and breath sounds normal. She has no wheezes.  Lymphadenopathy:    She has no cervical adenopathy.  Neurological: She is alert and oriented to person, place, and time.  Skin: Skin is warm and dry.  Psychiatric: She has a normal mood and affect.          Assessment & Plan:  Sinusitis - likely viral but does have mildly erythematous right TM so will go ahead and hive her a rx for augmentin.  Can fill if ear gets worse. Sympomatic care with decongestants, netti=pot etc. H.O. Given.

## 2011-12-16 ENCOUNTER — Other Ambulatory Visit: Payer: Self-pay | Admitting: Family Medicine

## 2012-02-07 ENCOUNTER — Emergency Department
Admission: EM | Admit: 2012-02-07 | Discharge: 2012-02-07 | Disposition: A | Discharge status: Home / Self Care | Payer: Managed Care, Other (non HMO) | Source: Home / Self Care

## 2012-02-07 DIAGNOSIS — R109 Unspecified abdominal pain: Secondary | ICD-10-CM

## 2012-02-07 DIAGNOSIS — R112 Nausea with vomiting, unspecified: Secondary | ICD-10-CM

## 2012-02-07 DIAGNOSIS — K529 Noninfective gastroenteritis and colitis, unspecified: Secondary | ICD-10-CM

## 2012-02-07 DIAGNOSIS — K219 Gastro-esophageal reflux disease without esophagitis: Secondary | ICD-10-CM

## 2012-02-07 HISTORY — DX: Depression, unspecified: F32.A

## 2012-02-07 HISTORY — DX: Major depressive disorder, single episode, unspecified: F32.9

## 2012-02-07 HISTORY — DX: Anxiety disorder, unspecified: F41.9

## 2012-02-07 LAB — POCT URINALYSIS DIP (MANUAL ENTRY)
Glucose, UA: NEGATIVE
Spec Grav, UA: >=1.03 (ref 1.005–1.03)
Urobilinogen, UA: 0.2 (ref 0–1)

## 2012-02-07 MED ORDER — LANSOPRAZOLE 30 MG PO CPDR: 30.0000 mg | DELAYED_RELEASE_CAPSULE | Freq: Every day | ORAL | Status: DC

## 2012-02-07 NOTE — ED Notes (Signed)
Monique Austin complains of stomach cramps last night and nausea, vomiting and diarrhea this morning. Denies chills. She also complains of fever and sweats.

## 2012-02-07 NOTE — ED Provider Notes (Signed)
History     CSN: [id number]  Arrival date & time 02/07/12  4098   First MD Initiated Contact with Patient 02/07/12 1015      No chief complaint on file. HPI Comments: Patient presents today with chief complaint of nausea, vomiting, diarrhea x1 day. Patient states his symptoms began last night. Patient had one episode of nonbilious nonbloody emesis. Nausea resolved after this. Patient had 2 episodes of loose bowel movements. Bowel movements were nonbloody. Patient also reports a mild upper back pain associated symptoms. Patient states she's had recurrent episodes like this in the past. Back pain is usually relieved with gas-ex.  Of note patient recently finished up her menstrual cycle and has been taking high-dose ibuprofen. Patient satiated fatty meal, [employer], prior to onset of symptoms. Patient reports mild generalized abdominal pain approximately one hour prior to onset of symptoms. Patient is now currently asymptomatic.    Past Medical History  Diagnosis Date  . Thyroid disease   . Allergy     No past surgical history on file.  Family History  Problem Relation Age of Onset  . Cancer Mother 10    postmenopausal breast   . Hyperlipidemia Father   . Hypertension Father     History  Substance Use Topics  . Smoking status: Never Smoker   . Smokeless tobacco: Not on file  . Alcohol Use: No    OB History    Grav Para Term Preterm Abortions TAB SAB Ect Mult Living                  Review of Systems  All other systems reviewed and are negative.    Allergies  Review of patient's allergies indicates no known allergies.  Home Medications   Current Outpatient Rx  Name Route Sig Dispense Refill  . BETAMETHASONE VALERATE 0.12 % EX FOAM Topical Apply topically.      Marland Kitchen CLOBETASOL PROPIONATE 0.05 % EX CREA Topical Apply topically.      Marland Kitchen CLOBETASOL PROPIONATE E 0.05 % EX CREA  APPLY TWICE DAILY FOR 4 WEEKS, THEN STOP FOR 1 WEEK 60 g 1  . DESONIDE 0.05 % EX CREA  Topical Apply topically.      Marland Kitchen FLUOXETINE HCL 40 MG PO CAPS Oral Take 1 capsule (40 mg total) by mouth daily. 30 capsule 2  . FLUTICASONE FUROATE 27.5 MCG/SPRAY NA SUSP Nasal Place 2 sprays into the nose daily. 2 sprays in each nostril daily 10 g 12  . LEVOTHYROXINE SODIUM 112 MCG PO TABS Oral Take 1 tablet (112 mcg total) by mouth daily. 30 tablet 3  . NORGESTIM-ETH ESTRAD TRIPHASIC 0.18/0.215/0.25 MG-35 MCG PO TABS Oral Take 1 tablet by mouth daily. 28 tablet 6  . VECTICAL 3 MCG/GM EX OINT  APPLY TOPICALLY TO AFFECTED AREAS 1-2 TIMES DAILY 100 g 0    There were no vitals taken for this visit.  Physical Exam  Constitutional:       Alert, morbidly obese, no acute distress   HENT:  Head: Normocephalic and atraumatic.  Right Ear: External ear normal.  Left Ear: External ear normal.  Eyes: Conjunctivae are normal. Pupils are equal, round, and reactive to light.  Neck: Normal range of motion. Neck supple.  Cardiovascular: Normal rate and regular rhythm.   Pulmonary/Chest: Effort normal and breath sounds normal.  Abdominal: Soft. There is tenderness. There is guarding.       Obese abdomen    Musculoskeletal: Normal range of motion.  No lumbar or thoracic TTP  Neurological: She is alert.  Skin: Skin is warm.    ED Course  Procedures (including critical care time)  Labs Reviewed - No data to display No results found.   No diagnosis found.    MDM  Overall exam is very reassuring. Pt is currently asymptomatic.  Given history, I suspect this is a subacute on chronic issue with elements of untreated GERD,  ? Biliary colic, and obesity.  Will start pt on PPI.  Discussed NSAID avoidance. Use tylenol instead.  Discussed high fat and fast food avoidance.  Weight loss.  Plan to follow up with PCP in 1 week for a follow up appt.  RTC sooner if pt becomes symptomatic. Would perform CT Abd Pelvis w/ and w/o contrast as well as CBC, CMET, lipase (r/o panceatitis). Handout given.    Pt expressed understanding of plan.     The patient and/or caregiver has been counseled thoroughly with regard to treatment plan and/or medications prescribed including dosage, schedule, interactions, rationale for use, and possible side effects and they verbalize understanding. Diagnoses and expected course of recovery discussed and will return if not improved as expected or if the condition worsens. Patient and/or caregiver verbalized understanding.               Floydene Flock, MD 02/07/12 1102

## 2012-02-09 NOTE — ED Provider Notes (Signed)
Agree with exam, assessment, and plan.   Lattie Haw, MD 02/09/12 1415

## 2012-02-10 ENCOUNTER — Telehealth: Payer: Self-pay | Admitting: *Deleted

## 2012-02-23 ENCOUNTER — Other Ambulatory Visit: Payer: Self-pay | Admitting: Family Medicine

## 2012-02-23 ENCOUNTER — Other Ambulatory Visit: Payer: Self-pay | Admitting: *Deleted

## 2012-02-23 MED ORDER — FLUOXETINE HCL 40 MG PO CAPS: 40.0000 mg | ORAL_CAPSULE | Freq: Every day | ORAL | Status: DC

## 2012-04-10 ENCOUNTER — Other Ambulatory Visit: Payer: Self-pay | Admitting: Family Medicine

## 2012-05-18 ENCOUNTER — Other Ambulatory Visit: Payer: Self-pay | Admitting: Family Medicine

## 2012-05-27 ENCOUNTER — Other Ambulatory Visit: Payer: Self-pay | Admitting: Family Medicine

## 2012-05-28 ENCOUNTER — Other Ambulatory Visit: Payer: Self-pay | Admitting: Family Medicine

## 2012-07-09 ENCOUNTER — Other Ambulatory Visit: Payer: Self-pay | Admitting: Family Medicine

## 2012-07-25 ENCOUNTER — Emergency Department
Admission: EM | Admit: 2012-07-25 | Discharge: 2012-07-25 | Disposition: A | Discharge status: Home / Self Care | Payer: Managed Care, Other (non HMO) | Source: Home / Self Care | Attending: Emergency Medicine | Admitting: Emergency Medicine

## 2012-07-25 DIAGNOSIS — R509 Fever, unspecified: Secondary | ICD-10-CM

## 2012-07-25 DIAGNOSIS — J111 Influenza due to unidentified influenza virus with other respiratory manifestations: Secondary | ICD-10-CM

## 2012-07-25 DIAGNOSIS — R1110 Vomiting, unspecified: Secondary | ICD-10-CM

## 2012-07-25 LAB — POCT INFLUENZA A/B: Influenza B, POC: NEGATIVE

## 2012-07-25 MED ORDER — OSELTAMIVIR PHOSPHATE 75 MG PO CAPS: 75.0000 mg | ORAL_CAPSULE | Freq: Two times a day (BID) | ORAL | Status: DC

## 2012-07-25 MED ORDER — ONDANSETRON HCL 4 MG PO TABS: 4.0000 mg | ORAL_TABLET | Freq: Three times a day (TID) | ORAL | Status: DC | PRN

## 2012-07-25 MED ORDER — ONDANSETRON HCL 4 MG PO TABS: 4.0000 mg | ORAL_TABLET | Freq: Four times a day (QID) | ORAL | Status: DC

## 2012-07-25 NOTE — ED Provider Notes (Signed)
History     CSN: [id number]  Arrival date & time 07/25/12  1728   First MD Initiated Contact with Patient 07/25/12 1754      Chief Complaint  Patient presents with  . Fever    x 2 days  . Emesis    x 1 day    (Consider location/radiation/quality/duration/timing/severity/associated sxs/prior treatment) HPI Monique Austin is a 37 y.o. female who complains of onset of cold symptoms for < 24 hours.  The symptoms are constant and moderate in severity.  No flu shot this year.  Multiple people at her work have been diagnosed with the flu over the last few days.  Not taking any OTC medications. No sore throat + cough No pleuritic pain No wheezing + nasal congestion + post-nasal drainage No sinus pain/pressure No chest congestion No itchy/red eyes No earache No hemoptysis No SOB + chills/sweats + fever + nausea + vomiting No abdominal pain + diarrhea No skin rashes + fatigue + myalgias     Past Medical History  Diagnosis Date  . Thyroid disease   . Allergy   . Anxiety   . Depression     History reviewed. No pertinent past surgical history.  Family History  Problem Relation Age of Onset  . Cancer Mother 61    postmenopausal breast   . Hyperlipidemia Father   . Hypertension Father     History  Substance Use Topics  . Smoking status: Never Smoker   . Smokeless tobacco: Never Used  . Alcohol Use: No    OB History    Grav Para Term Preterm Abortions TAB SAB Ect Mult Living                  Review of Systems  All other systems reviewed and are negative.    Allergies  Review of patient's allergies indicates no known allergies.  Home Medications   Current Outpatient Rx  Name  Route  Sig  Dispense  Refill  . BETAMETHASONE VALERATE 0.12 % EX FOAM   Topical   Apply topically.           Marland Kitchen CLOBETASOL PROPIONATE 0.05 % EX CREA   Topical   Apply topically.           Marland Kitchen CLOBETASOL PROPIONATE E 0.05 % EX CREA      APPLY TWICE DAILY FOR 4 WEEKS, THEN  STOP FOR 1 WEEK   60 g   1   . DESONIDE 0.05 % EX CREA      APPLY TO AFFECTED AREA AS DIRECTED   60 g   0   . FLUOXETINE HCL 40 MG PO CAPS   Oral   Take 1 capsule (40 mg total) by mouth daily.   30 capsule   0   . FLUOXETINE HCL 40 MG PO CAPS      TAKE ONE CAPSULE EVERY DAY   30 capsule   0   . LANSOPRAZOLE 30 MG PO CPDR   Oral   Take 1 capsule (30 mg total) by mouth daily.   30 capsule   3   . LEVOTHYROXINE SODIUM 112 MCG PO TABS      TAKE 1 TABLET EVERY DAY   30 tablet   1   . NORGESTIM-ETH ESTRAD TRIPHASIC 0.18/0.215/0.25 MG-35 MCG PO TABS   Oral   Take 1 tablet by mouth daily.   28 tablet   6   . VECTICAL 3 MCG/GM EX OINT      APPLY TOPICALLY  TO AFFECTED AREAS 1-2 TIMES DAILY   100 g   0   . FLUTICASONE FUROATE 27.5 MCG/SPRAY NA SUSP   Nasal   Place 2 sprays into the nose daily. 2 sprays in each nostril daily   10 g   12   . ONDANSETRON HCL 4 MG PO TABS   Oral   Take 1 tablet (4 mg total) by mouth every 8 (eight) hours as needed for nausea.   10 tablet   0   . ONDANSETRON HCL 4 MG PO TABS   Oral   Take 1 tablet (4 mg total) by mouth every 6 (six) hours.   12 tablet   0   . OSELTAMIVIR PHOSPHATE 75 MG PO CAPS   Oral   Take 1 capsule (75 mg total) by mouth 2 (two) times daily.   10 capsule   0   . OSELTAMIVIR PHOSPHATE 75 MG PO CAPS   Oral   Take 1 capsule (75 mg total) by mouth 2 (two) times daily.   10 capsule   0     BP 122/98  Pulse 118  Temp 101.7 F (38.7 C) (Oral)  Resp 18  Ht 5\' 2"  (1.575 m)  Wt 250 lb (113.399 kg)  BMI 45.73 kg/m2  SpO2 97%  Physical Exam  Nursing note and vitals reviewed. Constitutional: She is oriented to person, place, and time. She appears well-developed and well-nourished.  Non-toxic appearance. She appears ill.  HENT:  Head: Normocephalic and atraumatic.  Right Ear: Tympanic membrane, external ear and ear canal normal.  Left Ear: Tympanic membrane, external ear and ear canal normal.  Nose:  Mucosal edema and rhinorrhea present.  Mouth/Throat: Posterior oropharyngeal erythema present. No oropharyngeal exudate or posterior oropharyngeal edema.  Eyes: No scleral icterus.  Neck: Neck supple.  Cardiovascular: Regular rhythm and normal heart sounds.   Pulmonary/Chest: Effort normal and breath sounds normal. No respiratory distress. She has no decreased breath sounds. She has no wheezes. She has no rhonchi.  Neurological: She is alert and oriented to person, place, and time.  Skin: Skin is warm and dry. No rash noted.  Psychiatric: She has a normal mood and affect. Her speech is normal.    ED Course  Procedures (including critical care time)   Labs Reviewed  POCT INFLUENZA A/B - Normal   No results found.   1. Fever   2. Vomiting   3. Influenza-like illness       MDM  1)  This looks like influenza, but the rapid flu test is negative. I still think it's flu and will treat with Tamiflu since she is within 24 hours of onset + Zofran for her nausea.  Encourage hydration, rest. 2)  Use nasal saline solution (over the counter) at least 3 times a day. 3)  Can take tylenol every 6 hours or motrin every 8 hours for pain or fever. 4)  Follow up with your primary doctor if no improvement in 5-7 days, sooner if increasing pain, fever, or new symptoms.     Marlaine Hind, MD 07/25/12 [phone]

## 2012-07-25 NOTE — ED Notes (Signed)
Monique Austin complains of fever and chills starting yesterday. Today she has had a dry cough and vomiting.

## 2012-08-26 ENCOUNTER — Other Ambulatory Visit: Payer: Self-pay | Admitting: Family Medicine

## 2012-08-26 NOTE — Telephone Encounter (Signed)
What is this med? Is ok to fill

## 2012-09-09 ENCOUNTER — Other Ambulatory Visit: Payer: Self-pay | Admitting: Family Medicine

## 2012-09-10 ENCOUNTER — Encounter: Payer: Self-pay | Admitting: Physician Assistant

## 2012-09-10 ENCOUNTER — Ambulatory Visit (INDEPENDENT_AMBULATORY_CARE_PROVIDER_SITE_OTHER): Payer: Managed Care, Other (non HMO) | Admitting: Physician Assistant

## 2012-09-10 VITALS — BP 138/88 | HR 78 | Temp 98.3°F | Wt 257.0 lb

## 2012-09-10 DIAGNOSIS — H9209 Otalgia, unspecified ear: Secondary | ICD-10-CM

## 2012-09-10 DIAGNOSIS — H9201 Otalgia, right ear: Secondary | ICD-10-CM

## 2012-09-10 MED ORDER — ANTIPYRINE-BENZOCAINE 5.4-1.4 % OT SOLN: 3.0000 [drp] | OTIC | Status: DC | PRN

## 2012-09-10 MED ORDER — FLUTICASONE PROPIONATE 50 MCG/ACT NA SUSP: 2.0000 | Freq: Every day | NASAL | Status: DC

## 2012-09-10 MED ORDER — METHYLPREDNISOLONE (PAK) 4 MG PO TABS: 4.0000 mg | ORAL_TABLET | Freq: Every day | ORAL | Status: DC

## 2012-09-10 NOTE — Patient Instructions (Addendum)
Advil 800mg  every 8 hours.

## 2012-09-10 NOTE — Progress Notes (Signed)
  Subjective:    Patient ID: Monique Austin, female    DOB: [birth year], 37 y.o.   MRN: [id number]  Otalgia  There is pain in the right ear. This is a new problem. Episode onset: 3 days.  The problem occurs constantly. The problem has been gradually worsening. There has been no fever. The pain is at a severity of 5/10. The pain is moderate. Pertinent negatives include no abdominal pain, coughing, diarrhea, drainage, ear discharge, headaches, hearing loss, neck pain, rash, rhinorrhea, sore throat or vomiting. She has tried NSAIDs for the symptoms. The treatment provided mild relief. There is no history of a chronic ear infection or a tympanostomy tube.       Review of Systems  HENT: Positive for ear pain. Negative for hearing loss, sore throat, rhinorrhea, neck pain and ear discharge.   Respiratory: Negative for cough.   Gastrointestinal: Negative for vomiting, abdominal pain and diarrhea.  Skin: Negative for rash.  Neurological: Negative for headaches.       Objective:   Physical Exam  Constitutional: She is oriented to person, place, and time. She appears well-developed and well-nourished.  HENT:  Head: Normocephalic and atraumatic.  Right Ear: External ear normal.  Left Ear: External ear normal.  Mouth/Throat: Oropharynx is clear and moist. No oropharyngeal exudate.  Right TM slightly retracted at top of TM and air bubbles at base. Good light reflex.   Turbinates red and swollen bilaterally.  Negative for maxillary or frontal tenderness.  Eyes: Conjunctivae are normal. Right eye exhibits no discharge. Left eye exhibits no discharge.  Neck: Normal range of motion. Neck supple.  Cardiovascular: Normal rate, regular rhythm and normal heart sounds.   Pulmonary/Chest: Effort normal and breath sounds normal. She has no wheezes.  Lymphadenopathy:    She has no cervical adenopathy.  Neurological: She is alert and oriented to person, place, and time.  Skin: Skin is warm and dry.   Psychiatric: She has a normal mood and affect.          Assessment & Plan:  Right ear pain- discussed with patient no infection is present on exam. Seems like pain is due to pressure in inner ear or some eustachian tube disfunction. Start Medrol dose pak, flonase, decongestant. Can use auralgan for pain control. Call if not improving.

## 2012-11-07 ENCOUNTER — Other Ambulatory Visit: Payer: Self-pay | Admitting: Family Medicine

## 2012-11-12 ENCOUNTER — Other Ambulatory Visit: Payer: Self-pay | Admitting: Family Medicine

## 2012-11-29 ENCOUNTER — Telehealth: Payer: Self-pay | Admitting: *Deleted

## 2012-11-29 NOTE — Telephone Encounter (Signed)
Have her make appt

## 2012-11-29 NOTE — Telephone Encounter (Signed)
Pt calls and wants to know if you can give her something for the cramping she is having while on her period. States is taking 4 ibuprofen every 8 hours with no relief. Please advise

## 2012-11-30 NOTE — Telephone Encounter (Signed)
LMOM to call back and schedule appointment. Barry Dienes, LPN

## 2013-01-13 ENCOUNTER — Encounter: Payer: Self-pay | Admitting: *Deleted

## 2013-01-13 ENCOUNTER — Emergency Department
Admission: EM | Admit: 2013-01-13 | Discharge: 2013-01-13 | Disposition: A | Discharge status: Home / Self Care | Payer: Managed Care, Other (non HMO) | Source: Home / Self Care | Attending: Family Medicine | Admitting: Family Medicine

## 2013-01-13 DIAGNOSIS — F329 Major depressive disorder, single episode, unspecified: Secondary | ICD-10-CM

## 2013-01-13 MED ORDER — OLANZAPINE 2.5 MG PO TABS: 2.5000 mg | ORAL_TABLET | Freq: Every day | ORAL | Status: DC

## 2013-01-13 NOTE — ED Provider Notes (Signed)
History    CSN: [id number] Arrival date & time 01/13/13  1613  First MD Initiated Contact with Patient 01/13/13 1638     Chief Complaint  Patient presents with  . Depression  . Headache     HPI Comments: Patient has a long history of anxiety, presently taking Prozac 40mg  daily.  She had been on Zoloft in the past with poor response.  She admits that she has had an element of depression also, marginally controlled with Prozac.  She reports that she broke up with her boyfriend in October 2013.  They continued to live together, however, for financial convenience.  She had been generally emotionally stable until two weeks ago when she moved back to her own house. During the past week she has felt increasingly depressed, with frequent episodes of crying, difficulty concentrating, poor sleep, difficulty falling asleep, and early morning awakening.  She has had recurring headache, loose stools, and feels dizzy at times.  She has had occasional suicidal thoughts, but no plans.  The history is provided by the patient.   Past Medical History  Diagnosis Date  . Thyroid disease   . Allergy   . Anxiety   . Depression    History reviewed. No pertinent past surgical history. Family History  Problem Relation Age of Onset  . Cancer Mother 58    postmenopausal breast   . Hyperlipidemia Father   . Hypertension Father    History  Substance Use Topics  . Smoking status: Never Smoker   . Smokeless tobacco: Never Used  . Alcohol Use: No   OB History   Grav Para Term Preterm Abortions TAB SAB Ect Mult Living                 Review of Systems  Constitutional: Positive for activity change, appetite change and fatigue. Negative for fever and chills.  Eyes: Negative.   Respiratory: Positive for chest tightness. Negative for shortness of breath.   Cardiovascular: Negative.   Gastrointestinal: Positive for diarrhea. Negative for nausea, vomiting and abdominal pain.  Endocrine: Negative.    Genitourinary: Negative.   Musculoskeletal: Positive for arthralgias.  Skin: Negative.   Neurological: Positive for dizziness and headaches.  Psychiatric/Behavioral: Positive for suicidal ideas, sleep disturbance, dysphoric mood and decreased concentration. Negative for confusion, self-injury and agitation. The patient is nervous/anxious.     Allergies  Review of patient's allergies indicates no known allergies.  Home Medications   Current Outpatient Rx  Name  Route  Sig  Dispense  Refill  . antipyrine-benzocaine (AURALGAN) otic solution   Right Ear   Place 3 drops into the right ear every 2 (two) hours as needed for pain.   10 mL   0   . clobetasol (TEMOVATE) 0.05 % cream   Topical   Apply topically.           Marland Kitchen CLOBETASOL PROPIONATE E 0.05 % emollient cream      APPLY TO AFFECTED AREA TWICE A DAY X4 WEEKS THEN STOP X1 WEEK   60 g   0   . desonide (DESOWEN) 0.05 % cream      APPLY TO AFFECTED AREA AS DIRECTED   60 g   0   . FLUoxetine (PROZAC) 40 MG capsule      TAKE ONE CAPSULE BY MOUTH EVERY DAY   30 capsule   2   . fluticasone (FLONASE) 50 MCG/ACT nasal spray   Nasal   Place 2 sprays into the nose daily.  16 g   2   . levothyroxine (SYNTHROID, LEVOTHROID) 112 MCG tablet      TAKE 1 TABLET EVERY DAY   30 tablet   4   . methylPREDNIsolone (MEDROL DOSPACK) 4 MG tablet   Oral   Take 1 tablet (4 mg total) by mouth daily. follow package directions   21 tablet   0   . OLANZapine (ZYPREXA) 2.5 MG tablet   Oral   Take 1 tablet (2.5 mg total) by mouth at bedtime.   7 tablet   0   . VECTICAL 3 MCG/GM cream      APPLY TOPICALLY TO AFFECTED AREAS 1-2 TIMES DAILY   100 g   3    BP 137/97  Pulse 76  Temp(Src) 98.8 F (37.1 C) (Oral)  Resp 20  Ht 5\' 2"  (1.575 m)  Wt 257 lb 4 oz (116.688 kg)  BMI 47.04 kg/m2  SpO2 100% Physical Exam Nursing notes and Vital Signs reviewed. Appearance:  Patient appears stated age, and in no acute distress.   Patient is obese (BMI 47) Psychiatric:  Patient is alert and oriented with good eye contact.  Thoughts are organized.  No psychomotor retardation.  Memory intact.  Mood mildly depressed.  Affect flat.  Not suicidal  Eyes:  Pupils are equal, round, and reactive to light and accomodation.  Extraocular movement is intact.  Conjunctivae are not inflamed  Mouth/Pharynx:  Normal Neck:  Supple.   No adenopathy Lungs:  Clear to auscultation.  Breath sounds are equal.  Heart:  Regular rate and rhythm without murmurs, rubs, or gallops.  Abdomen:  Nontender without masses or hepatosplenomegaly.  Bowel sounds are present.  No CVA or flank tenderness.  Extremities:  No edema.   Skin:  No rash present.   Neurologic:  Cranial nerves 2 through 12 are normal.    ED Course  Procedures  none   1. DEPRESSION; decreased control     MDM   Continue Prozac.  Add low dose of mood stabilizer:  Zyprexa 2.5mg  at bedtime (Rx #7, 0 ref) Followup with PCP in about 6 days.  Red flags discussed. If symptoms become significantly worse during the night or over the weekend, proceed to the local emergency room.   Lattie Haw, MD 01/14/13 [phone]

## 2013-01-13 NOTE — ED Notes (Signed)
Patient c/o feeling depressed x 1 wk states "I am going through a break up and I have had thoughts of not living any more, I am not eating, have a headache, diarrhea, and dizzy."

## 2013-01-20 ENCOUNTER — Ambulatory Visit: Payer: Managed Care, Other (non HMO) | Admitting: Family Medicine

## 2013-01-20 DIAGNOSIS — Z0289 Encounter for other administrative examinations: Secondary | ICD-10-CM

## 2013-05-05 ENCOUNTER — Other Ambulatory Visit: Payer: Self-pay | Admitting: Family Medicine

## 2013-05-13 ENCOUNTER — Encounter: Payer: Self-pay | Admitting: Physician Assistant

## 2013-05-13 ENCOUNTER — Ambulatory Visit (INDEPENDENT_AMBULATORY_CARE_PROVIDER_SITE_OTHER): Payer: Managed Care, Other (non HMO) | Admitting: Physician Assistant

## 2013-05-13 VITALS — BP 140/86 | HR 87 | Wt 264.0 lb

## 2013-05-13 DIAGNOSIS — W57XXXA Bitten or stung by nonvenomous insect and other nonvenomous arthropods, initial encounter: Secondary | ICD-10-CM

## 2013-05-13 DIAGNOSIS — B009 Herpesviral infection, unspecified: Secondary | ICD-10-CM

## 2013-05-13 DIAGNOSIS — L739 Follicular disorder, unspecified: Secondary | ICD-10-CM

## 2013-05-13 DIAGNOSIS — L738 Other specified follicular disorders: Secondary | ICD-10-CM

## 2013-05-13 DIAGNOSIS — L0291 Cutaneous abscess, unspecified: Secondary | ICD-10-CM

## 2013-05-13 DIAGNOSIS — L678 Other hair color and hair shaft abnormalities: Secondary | ICD-10-CM

## 2013-05-13 DIAGNOSIS — L0390 Cellulitis, unspecified: Secondary | ICD-10-CM

## 2013-05-13 MED ORDER — VALACYCLOVIR HCL 1 G PO TABS: 1000.0000 mg | ORAL_TABLET | Freq: Two times a day (BID) | ORAL | Status: DC

## 2013-05-13 MED ORDER — DOXYCYCLINE HYCLATE 100 MG PO CAPS: 100.0000 mg | ORAL_CAPSULE | Freq: Two times a day (BID) | ORAL | Status: DC

## 2013-05-13 MED ORDER — NORGESTIM-ETH ESTRAD TRIPHASIC 0.18/0.215/0.25 MG-25 MCG PO TABS: 1.0000 | ORAL_TABLET | Freq: Every day | ORAL | Status: DC

## 2013-05-13 NOTE — Patient Instructions (Signed)
Folliculitis  Folliculitis is redness, soreness, and swelling (inflammation) of the hair follicles. This condition can occur anywhere on the body. People with weakened immune systems, diabetes, or obesity have a greater risk of getting folliculitis. CAUSES  Bacterial infection. This is the most common cause.  Fungal infection.  Viral infection.  Contact with certain chemicals, especially oils and tars. Long-term folliculitis can result from bacteria that live in the nostrils. The bacteria may trigger multiple outbreaks of folliculitis over time. SYMPTOMS Folliculitis most commonly occurs on the scalp, thighs, legs, back, buttocks, and areas where hair is shaved frequently. An early sign of folliculitis is a small, white or yellow, pus-filled, itchy lesion (pustule). These lesions appear on a red, inflamed follicle. They are usually less than 0.2 inches (5 mm) wide. When there is an infection of the follicle that goes deeper, it becomes a boil or furuncle. A group of closely packed boils creates a larger lesion (carbuncle). Carbuncles tend to occur in hairy, sweaty areas of the body. DIAGNOSIS  Your caregiver can usually tell what is wrong by doing a physical exam. A sample may be taken from one of the lesions and tested in a lab. This can help determine what is causing your folliculitis. TREATMENT  Treatment may include:  Applying warm compresses to the affected areas.  Taking antibiotic medicines orally or applying them to the skin.  Draining the lesions if they contain a large amount of pus or fluid.  Laser hair removal for cases of long-lasting folliculitis. This helps to prevent regrowth of the hair. HOME CARE INSTRUCTIONS  Apply warm compresses to the affected areas as directed by your caregiver.  If antibiotics are prescribed, take them as directed. Finish them even if you start to feel better.  You may take over-the-counter medicines to relieve itching.  Do not shave  irritated skin.  Follow up with your caregiver as directed. SEEK IMMEDIATE MEDICAL CARE IF:   You have increasing redness, swelling, or pain in the affected area.  You have a fever. MAKE SURE YOU:  Understand these instructions.  Will watch your condition.  Will get help right away if you are not doing well or get worse. Document Released: 09/15/2001 Document Revised: 01/06/2012 Document Reviewed: 10/07/2011 [facility] Patient Information 2014 [place], [place].   Fever Blisters, Herpes Simplex Herpes simplex is a virus. This virus causes fever blisters or cold sores. Fever blisters are small sores on the lips, gums, or roof of the mouth. People often get infected with this herpes virus but do not have any symptoms. The blisters may break out when a person is:  Tired.   Under stress.   Suffering from another infection (such as a cold).   Exposed to sunlight.  The blisters usually heal within 1 week. The virus can be easily passed to other people and to other parts of the body, such as the eyes and sex organs. CAUSES  A virus, herpes simplex, is the cause of fever blisters. This virus can be passed (transmitted) from person to person and is therefore contagious. There are 2 types of herpes simplex virus. Type 1 usually causes oral herpes or fever blisters. Type 2 usually causes genital herpes. Both viruses do have the potential to cause oral and genital infections. However, the type 1 virus causes more than 90% of recurrent fever blister outbreaks.  Herpes simplex virus is highly contagious when fever blisters are present. Close contact, including kissing, can spread the virus. Children often become infected by contact with  others who have fever blisters. A child can spread the virus by rubbing the cold sore and touching other children or when other children touch clothing, wipes, or toys contaminated by an infected child with the virus. In adults, about 10% of oral herpes infections are  from oral-genital sex with a person who has active genital herpes (type 2).  Type 1 herpes infection is very common, eventually occurring in up to 8 out of 10 otherwise healthy people. Most people become infected before they are 36 years old. The virus usually infects the lips, throat, or mouth. Initial infection in children can be extensive with many lesions throughout the mouth. In adults, the first infection may cause no symptoms. Some adults may develop many fluid-filled blisters inside and outside the mouth 3 to 5 days after they are initially infected but severe infection is uncommon. Fever, swollen neck glands, and general aches may occur but this is also uncommon. The blisters tend to come together and then collapse. When on the lip, a yellowish crust forms over the sores. Healing of the area without scarring typically occurs within 2 weeks. Once a person is infected, the herpes virus permanently remains alive in the body within a nerve near the cheekbone. It then stays inactive at this site, only to sometimes travel down the nerve to the skin. This causes a recurrence of fever blisters. Recurrent blisters usually break out at the outside edge of the lip or edge of the nostril. Recurrent fever blisters may occasionally occur on the chin, cheeks, or inside the mouth. Recurrent fever blister attacks are usually not as painful and not as numerous as the first infection. Recurrences are less frequent after age 63. Many people who have recurring fever blisters feel itching, tingling, or burning at the lip border. This can occur hours or a couple days before the blister appears.  Factors which weaken the body's immune system may trigger an outbreak or recurrence of herpes. These include some drugs (such as steroids), emotional stress, fever, illness, sleep deprivation, and other injuries. Sunlight may also trigger an outbreak. Many women have recurrences only during their menstrual period.  TREATMENT There is  no cure for fever blisters. There is no vaccine for herpes simplex virus.  Certain medicines can relieve some of the pain and discomfort of the sores or promote more rapid healing. These include ointments that numb the blisters and medicines that control bacterial infections (antibiotics). A number of drugs active against herpes viruses (antivirals), either applied locally as a gel or cream, or taken in pill form, may promote healing by keeping the virus from multiplying and infecting more local tissue.   Keep fever blisters clean and dry. This helps to prevent bacterial invasion of the virally infected tissues.   Eat a soft, bland diet to avoid irritating the sores.   Be careful not to touch the sores and spread the virus to new sites, such as:   Other areas of the face.   Eyes.   Genitals.   Make sure you do not infect others. Avoid kissing people when a fever blister is present. Avoid touching the sores and then touching others.   Sunscreen on the lips can prevent recurrences if outbreaks are triggered by sunlight. The sunscreen should be put on before going outside and reapplied often while in the sun.   Avoid stress if this seems to cause outbreaks.  HOME CARE INSTRUCTIONS   Only take over-the-counter or prescription medicines for pain, discomfort, or fever as  directed by your caregiver. Do not use aspirin.   Do not touch the blisters or pick the scabs. Wash your hands often. Do not touch your eyes without washing your hands first.   Avoid close contact with other people, especially kissing, until blisters heal.   Hot, cold, or salty foods may hurt your mouth. Use a straw to drink. Eating a well-balanced diet will help healing.  SEEK MEDICAL CARE IF:   Your eye feels irritated, painful, or you feel like you have something in your eye.   You develop a fever, feel achy, or see pus instead of clear fluid in the sores. These are signs of a bacterial infection.   You get blisters  on your genitals.   You develop new, unexplained symptoms.  MAKE SURE YOU:   Understand these instructions.   Will watch your condition.   Will get help right away if you are not doing well or get worse.  Document Released: 07/07/2005 Document Revised: 06/26/2011 Document Reviewed: 11/11/2007 [facility] Patient Information 2012 [place], [place].

## 2013-05-15 NOTE — Progress Notes (Signed)
  Subjective:    Patient ID: Monique Austin, female    DOB: 31-Dec-1975, 37 y.o.   MRN: [id number]  HPI Patient presents to the clinic with CC of red spot on her left breast. Pt also has a red spot on her lip and tiny red bumps on her suprapubic region.   She noticed the red spot on her left breast this morning. It does appear to be increasing in size. It is not painful and does not have any discharge or swelling associated. She denies any fever, chills, n/v/d. She does not itch. She has not tried anything to make better.   Spot on lip has been present for a couple of days. Never had anything like it before. Does feel like it burns a little bit and tender to touch. No discharge. Not tried anything to make better.  Red bumps on vaginal area been present for last couple of days. She does shave but denies any waxing. Seem to be worsening.    Review of Systems     Objective:   Physical Exam  Skin:             Assessment & Plan:  Bug bite/cellulitis- discussed cold compresses. Gave doxy for folliculitis which would also help with any cellulitis. Reassure pt did not appear to be black widow or brown recluse. Ibuprofen could help with any discomfort. Call if worsening.   Folliculitis- HO given. Doxy for 10 days.   Cold sore- discussed with pt appears lip lesion is a cold sore. Gave acyclovir. Gave HO with symptomatic care. Discussed with patient and STD testing. Pt stated she would consider. She denies condom usage and she does have multiple partners.

## 2013-06-08 ENCOUNTER — Other Ambulatory Visit: Payer: Self-pay | Admitting: Family Medicine

## 2013-06-24 ENCOUNTER — Other Ambulatory Visit: Payer: Self-pay | Admitting: Family Medicine

## 2013-10-07 ENCOUNTER — Ambulatory Visit (INDEPENDENT_AMBULATORY_CARE_PROVIDER_SITE_OTHER): Payer: 59 | Admitting: Physician Assistant

## 2013-10-07 ENCOUNTER — Encounter: Payer: Self-pay | Admitting: Physician Assistant

## 2013-10-07 VITALS — BP 132/79 | HR 83 | Temp 98.3°F | Ht 62.0 in | Wt 275.0 lb

## 2013-10-07 DIAGNOSIS — R059 Cough, unspecified: Secondary | ICD-10-CM

## 2013-10-07 DIAGNOSIS — A499 Bacterial infection, unspecified: Secondary | ICD-10-CM

## 2013-10-07 DIAGNOSIS — J329 Chronic sinusitis, unspecified: Secondary | ICD-10-CM

## 2013-10-07 DIAGNOSIS — B9689 Other specified bacterial agents as the cause of diseases classified elsewhere: Secondary | ICD-10-CM

## 2013-10-07 DIAGNOSIS — R05 Cough: Secondary | ICD-10-CM

## 2013-10-07 MED ORDER — BENZONATATE 200 MG PO CAPS: 200.0000 mg | ORAL_CAPSULE | Freq: Two times a day (BID) | ORAL | Status: DC | PRN

## 2013-10-07 MED ORDER — AMOXICILLIN 500 MG PO CAPS: 500.0000 mg | ORAL_CAPSULE | Freq: Two times a day (BID) | ORAL | Status: DC

## 2013-10-07 MED ORDER — HYDROCODONE-HOMATROPINE 5-1.5 MG/5ML PO SYRP: 5.0000 mL | ORAL_SOLUTION | Freq: Every evening | ORAL | Status: DC | PRN

## 2013-10-07 NOTE — Progress Notes (Signed)
   Subjective:    Patient ID: Monique Austin, female    DOB: [birth year], 38 y.o.   MRN: [id number]  HPI Patient is a 38 year old female who presents to the clinic with 7 days of sinus pressure, nasal congestion, headache and cough. She recently had a sore throat but it has resolved. She's tried DayQuil and NyQuil with minimal relief. She is coughing off throughout the day as well as at night. She is having a hard time getting rest. She denies any fever, chills, nausea, vomiting or ear pain. She's not had any body aches. She reports her worst symptom is sinus pressure and and headache. Her cough is not productive. She denies any wheezing or shortness of breath.    Review of Systems     Objective:   Physical Exam  Constitutional: She is oriented to person, place, and time. She appears well-developed and well-nourished.  HENT:  Head: Normocephalic and atraumatic.  Right Ear: External ear normal.  Left Ear: External ear normal.  Right TM was clear. Left TM was bulging with some dullness behind the membrane as well as erythema.  Oropharynx was erythematous without any tonsillar swelling or exudate.  Pressure with palpation around nasal bridge and frontal sinuses. There was a bad odor to breath.  Bilateral nares the turbinates were red and swollen.  Eyes: Conjunctivae are normal. Right eye exhibits no discharge. Left eye exhibits no discharge.  Neck: Normal range of motion. Neck supple.  Cardiovascular: Normal rate, regular rhythm and normal heart sounds.   Pulmonary/Chest: Effort normal and breath sounds normal.  Lymphadenopathy:    She has no cervical adenopathy.  Neurological: She is alert and oriented to person, place, and time.  Skin: Skin is dry.  Psychiatric: She has a normal mood and affect. Her behavior is normal.          Assessment & Plan:  Bacterial sinus infection-will treat with Amoxil 410 days. Discuss with patient to take all of the antibiotic. Encourage  over-the-counter Flonase. I did give patient hycodan for cough as well as Tessalon Perles for cough during the day. If not improving in the next 5-7 days please call office. Gave handout for symptomatic care for sinusitis.

## 2013-10-07 NOTE — Patient Instructions (Signed)

## 2013-10-26 ENCOUNTER — Other Ambulatory Visit: Payer: Self-pay | Admitting: Family Medicine

## 2013-10-27 NOTE — Telephone Encounter (Signed)
OK to fill

## 2013-10-29 ENCOUNTER — Encounter: Payer: Self-pay | Admitting: Emergency Medicine

## 2013-10-29 ENCOUNTER — Emergency Department (INDEPENDENT_AMBULATORY_CARE_PROVIDER_SITE_OTHER): Payer: 59

## 2013-10-29 ENCOUNTER — Emergency Department (INDEPENDENT_AMBULATORY_CARE_PROVIDER_SITE_OTHER)
Admission: EM | Admit: 2013-10-29 | Discharge: 2013-10-29 | Disposition: A | Discharge status: Home / Self Care | Payer: 59 | Source: Home / Self Care | Attending: Family Medicine | Admitting: Family Medicine

## 2013-10-29 DIAGNOSIS — S99929A Unspecified injury of unspecified foot, initial encounter: Secondary | ICD-10-CM

## 2013-10-29 DIAGNOSIS — W19XXXA Unspecified fall, initial encounter: Secondary | ICD-10-CM

## 2013-10-29 DIAGNOSIS — M549 Dorsalgia, unspecified: Secondary | ICD-10-CM

## 2013-10-29 DIAGNOSIS — S99919A Unspecified injury of unspecified ankle, initial encounter: Secondary | ICD-10-CM

## 2013-10-29 DIAGNOSIS — S8990XA Unspecified injury of unspecified lower leg, initial encounter: Secondary | ICD-10-CM

## 2013-10-29 DIAGNOSIS — S8992XA Unspecified injury of left lower leg, initial encounter: Secondary | ICD-10-CM

## 2013-10-29 MED ORDER — CYCLOBENZAPRINE HCL 10 MG PO TABS: 10.0000 mg | ORAL_TABLET | Freq: Three times a day (TID) | ORAL | Status: DC | PRN

## 2013-10-29 MED ORDER — MELOXICAM 15 MG PO TABS: 15.0000 mg | ORAL_TABLET | Freq: Every day | ORAL | Status: DC

## 2013-10-29 NOTE — ED Provider Notes (Signed)
CSN: [id number]     Arrival date & time 10/29/13  1410 History   First MD Initiated Contact with Patient 10/29/13 1417     Chief Complaint  Patient presents with  . Fall  . Knee Pain  . Back Pain    HPI Back and knee pain x 2 days s/p fall Pt accidentally fell while walking, landing on L knee Has had L knee as well as upper back pain since this point.  No knee locking or giving away. Minimal knee swelling. Pain is predominantly on anterior knee.  Upper back pain is generalized  No radicular sxs     Past Medical History  Diagnosis Date  . Thyroid disease   . Allergy   . Anxiety   . Depression    History reviewed. No pertinent past surgical history. Family History  Problem Relation Age of Onset  . Cancer Mother [id number]    postmenopausal breast   . Hyperlipidemia Father   . Hypertension Father    History  Substance Use Topics  . Smoking status: Never Smoker   . Smokeless tobacco: Never Used  . Alcohol Use: No   OB History   Grav Para Term Preterm Abortions TAB SAB Ect Mult Living                 Review of Systems  All other systems reviewed and are negative.   Allergies  Review of patient's allergies indicates no known allergies.  Home Medications   Current Outpatient Rx  Name  Route  Sig  Dispense  Refill  . clobetasol (TEMOVATE) 0.05 % cream   Topical   Apply topically.           Marland Kitchen. CLOBETASOL PROPIONATE E 0.05 % emollient cream      APPLY TO AFFECTED AREA TWICE A DAY X4 WEEKS THEN STOP X1 WEEK   60 g   0   . desonide (DESOWEN) 0.05 % cream      APPLY TO AFFECTED AREA AS DIRECTED   60 g   0   . FLUoxetine (PROZAC) 40 MG capsule      TAKE ONE CAPSULE BY MOUTH EVERY DAY   30 capsule   2   . fluticasone (FLONASE) 50 MCG/ACT nasal spray   Nasal   Place 2 sprays into the nose daily.   16 g   2   . levothyroxine (SYNTHROID, LEVOTHROID) 112 MCG tablet      TAKE 1 TABLET BY MOUTH EVERY DAY   30 tablet   2   . VECTICAL 3 MCG/GM cream     APPLY TOPICALLY TO AFFECTED AREAS 1-2 TIMES DAILY   100 g   3   . amoxicillin (AMOXIL) 500 MG capsule   Oral   Take 1 capsule (500 mg total) by mouth 2 (two) times daily. For 10 days.   20 capsule   0   . benzonatate (TESSALON) 200 MG capsule   Oral   Take 1 capsule (200 mg total) by mouth 2 (two) times daily as needed for cough.   20 capsule   0   . HYDROcodone-homatropine (HYCODAN) 5-1.5 MG/5ML syrup   Oral   Take 5 mLs by mouth at bedtime as needed for cough.   120 mL   0   . Norgestimate-Ethinyl Estradiol Triphasic 0.18/0.215/0.25 MG-25 MCG tab   Oral   Take 1 tablet by mouth daily.   1 Package   11   . OLANZapine (ZYPREXA) 2.5 MG tablet  Oral   Take 1 tablet (2.5 mg total) by mouth at bedtime.   7 tablet   0   . valACYclovir (VALTREX) 1000 MG tablet   Oral   Take 1 tablet (1,000 mg total) by mouth 2 (two) times daily. For 10 days.   20 tablet   0    BP 126/89  Pulse 72  Temp(Src) 98.2 F (36.8 C) (Oral)  Resp 16  Ht 5\' 2"  (1.575 m)  SpO2 97%  LMP 10/27/2013 Physical Exam  Constitutional:  Morbidly obese    HENT:  Head: Normocephalic and atraumatic.  Eyes: Conjunctivae are normal. Pupils are equal, round, and reactive to light.  Neck: Normal range of motion.  Cardiovascular: Normal rate and regular rhythm.   Pulmonary/Chest: Effort normal and breath sounds normal.  Abdominal: Soft.  Musculoskeletal:       Back:       Legs: + mild TTP across affected area  Pain worse with resisted neck flexion/extension      ED Course  Procedures (including critical care time) Labs Review Labs Reviewed - No data to display Imaging Review Dg Thoracic Spine 2 View  10/29/2013   CLINICAL DATA:  Pain status post fall  EXAM: THORACIC SPINE - 2 VIEW  COMPARISON:  None.  FINDINGS: There is no evidence of thoracic spine fracture. Alignment is normal. No other significant bone abnormalities are identified.  IMPRESSION: Negative.   Electronically Signed   By:  Salome Holmes M.D.   On: 10/29/2013 15:02   Dg Knee Complete 4 Views Left  10/29/2013   CLINICAL DATA:  injury injury  EXAM: LEFT KNEE - COMPLETE 4+ VIEW  COMPARISON:  None.  FINDINGS: There is no evidence of fracture, dislocation, or joint effusion. There is no evidence of arthropathy or other focal bone abnormality. Soft tissues are unremarkable.  IMPRESSION: Negative.   Electronically Signed   By: Salome Holmes M.D.   On: 10/29/2013 15:06     MDM   1. Left knee injury   2. Fall   3. Upper back pain    t spine and knee xrays WNL Suspect traumatic sprain of L knee and upper back  L knee brace  Offered toradol-pt refused  Will place on mobic and flexeril, RICE Discussed supportive care and MSK red flags.  Follow up with sports medicine if sxs not improved.     The patient and/or caregiver has been counseled thoroughly with regard to treatment plan and/or medications prescribed including dosage, schedule, interactions, rationale for use, and possible side effects and they verbalize understanding. Diagnoses and expected course of recovery discussed and will return if not improved as expected or if the condition worsens. Patient and/or caregiver verbalized understanding.          Doree Albee, MD 10/29/13 [phone]

## 2013-10-29 NOTE — ED Notes (Signed)
Monique Austin complains of back and knee pain after a fall last night. She rates her pain as a 7/10 on the pain scale. She did take 800 mg of Advil last night.

## 2013-12-12 ENCOUNTER — Other Ambulatory Visit: Payer: Self-pay | Admitting: Family Medicine

## 2014-03-31 ENCOUNTER — Other Ambulatory Visit: Payer: Self-pay | Admitting: Family Medicine

## 2014-05-18 ENCOUNTER — Other Ambulatory Visit: Payer: Self-pay | Admitting: Family Medicine

## 2014-05-19 ENCOUNTER — Other Ambulatory Visit: Payer: Self-pay | Admitting: *Deleted

## 2014-05-19 MED ORDER — CLOBETASOL PROP EMOLLIENT BASE 0.05 % EX CREA: TOPICAL_CREAM | CUTANEOUS | Status: DC

## 2014-05-22 ENCOUNTER — Other Ambulatory Visit: Payer: Self-pay | Admitting: Family Medicine

## 2014-07-09 ENCOUNTER — Other Ambulatory Visit: Payer: Self-pay | Admitting: Family Medicine

## 2015-05-02 ENCOUNTER — Encounter: Payer: Self-pay | Admitting: Osteopathic Medicine

## 2015-05-02 ENCOUNTER — Ambulatory Visit (INDEPENDENT_AMBULATORY_CARE_PROVIDER_SITE_OTHER): Payer: BLUE CROSS/BLUE SHIELD | Admitting: Osteopathic Medicine

## 2015-05-02 VITALS — BP 140/92 | HR 86 | Ht 62.0 in | Wt 291.0 lb

## 2015-05-02 DIAGNOSIS — R0789 Other chest pain: Secondary | ICD-10-CM

## 2015-05-02 DIAGNOSIS — L309 Dermatitis, unspecified: Secondary | ICD-10-CM | POA: Diagnosis not present

## 2015-05-02 DIAGNOSIS — F418 Other specified anxiety disorders: Secondary | ICD-10-CM | POA: Diagnosis not present

## 2015-05-02 DIAGNOSIS — E039 Hypothyroidism, unspecified: Secondary | ICD-10-CM

## 2015-05-02 DIAGNOSIS — IMO0001 Reserved for inherently not codable concepts without codable children: Secondary | ICD-10-CM

## 2015-05-02 DIAGNOSIS — R030 Elevated blood-pressure reading, without diagnosis of hypertension: Secondary | ICD-10-CM

## 2015-05-02 DIAGNOSIS — Z8639 Personal history of other endocrine, nutritional and metabolic disease: Secondary | ICD-10-CM

## 2015-05-02 DIAGNOSIS — F4323 Adjustment disorder with mixed anxiety and depressed mood: Secondary | ICD-10-CM | POA: Insufficient documentation

## 2015-05-02 DIAGNOSIS — Z79899 Other long term (current) drug therapy: Secondary | ICD-10-CM

## 2015-05-02 LAB — CBC WITH DIFFERENTIAL/PLATELET
Basophils Absolute: 0 10*3/uL (ref 0.0–0.1)
Basophils Relative: 0 % (ref 0–1)
EOS ABS: 0.3 10*3/uL (ref 0.0–0.7)
EOS PCT: 3 % (ref 0–5)
HEMATOCRIT: 38.8 % (ref 36.0–46.0)
Hemoglobin: 13.1 g/dL (ref 12.0–15.0)
LYMPHS ABS: 2.4 10*3/uL (ref 0.7–4.0)
LYMPHS PCT: 26 % (ref 12–46)
MCH: 29.1 pg (ref 26.0–34.0)
MCHC: 33.8 g/dL (ref 30.0–36.0)
MCV: 86.2 fL (ref 78.0–100.0)
MONOS PCT: 7 % (ref 3–12)
MPV: 9.5 fL (ref 8.6–12.4)
Monocytes Absolute: 0.6 10*3/uL (ref 0.1–1.0)
Neutro Abs: 5.9 10*3/uL (ref 1.7–7.7)
Neutrophils Relative %: 64 % (ref 43–77)
PLATELETS: 347 10*3/uL (ref 150–400)
RBC: 4.5 MIL/uL (ref 3.87–5.11)
RDW: 13.4 % (ref 11.5–15.5)
WBC: 9.2 10*3/uL (ref 4.0–10.5)

## 2015-05-02 MED ORDER — CITALOPRAM HYDROBROMIDE 20 MG PO TABS: 20.0000 mg | ORAL_TABLET | Freq: Every day | ORAL | Status: DC

## 2015-05-02 MED ORDER — LEVOTHYROXINE SODIUM 112 MCG PO TABS: 112.0000 ug | ORAL_TABLET | Freq: Every day | ORAL | Status: DC

## 2015-05-02 MED ORDER — LEVOTHYROXINE SODIUM 112 MCG PO TABS
112.0000 ug | ORAL_TABLET | Freq: Every day | ORAL | Status: DC
Start: 2015-05-02 — End: 2015-05-02

## 2015-05-02 MED ORDER — CLOBETASOL PROP EMOLLIENT BASE 0.05 % EX CREA: TOPICAL_CREAM | CUTANEOUS | Status: DC

## 2015-05-02 NOTE — Addendum Note (Signed)
Addended by: Collie SiadICHARDSON, [place]ola Mittman M on: 05/02/2015 02:05 PM   Modules accepted: Orders

## 2015-05-02 NOTE — Progress Notes (Signed)
HPI: Monique Austin is a 39 y.o. female who [facility]  today for chief complaint of: No chief complaint on file.  Not seen since 2015, lost insurance. Anxiety has been worse lately, has had to miss work for it.  . Quality: Reports moodiness, irritable  . Severity: moderate to severe . Duration: several months.  . Timing:  . Context: . Modifying factors: Was on Zoloft in the past then on Prozac, has been off that medication since 06/2014, also got off thyroid medication. Prozac was helping to a degree.  . Assoc signs/symptoms: occasional chest pains, nausea. Also having a gard time with sleep onset and maintenance.   Also requests eczema cream refill.   Past medical, social and family history reviewed: Past Medical History  Diagnosis Date  . Thyroid disease   . Allergy   . Anxiety   . Depression    No past surgical history on file. Social History  Substance Use Topics  . Smoking status: Never Smoker   . Smokeless tobacco: Never Used  . Alcohol Use: No   Family History  Problem Relation Age of Onset  . Cancer Mother 959    postmenopausal breast   . Hyperlipidemia Father   . Hypertension Father     No Known Allergies   Medications reviewed - no Rx medications.      Review of Systems: CONSTITUTIONAL: Neg fever/chills, no unintentional weight changes HEAD/EYES/EARS/NOSE/THROAT: No headache/vision change or hearing change, no sore throat CARDIAC: No chest pain/pressure/palpitations, no orthopnea RESPIRATORY: No cough/shortness of breath/wheeze GASTROINTESTINAL: No nausea/vomiting/abdominal pain/blood in stool/diarrhea/constipation MUSCULOSKELETAL: No myalgia/arthralgia GENITOURINARY: No incontinence, No abnormal genital bleeding/discharge SKIN: No rash/wounds/concerning lesions HEM/ONC: No easy bruising/bleeding, no abnormal lymph node ENDOCRINE: No polyuria/polydipsia/polyphagia, no heat/cold intolerance   NEUROLOGIC: No weakness/dizzines/slurred speech PSYCHIATRIC: (+) concerns with depression/anxiety or sleep problems    Exam:  BP 140/92 mmHg  Pulse 86  Ht 5\' 2"  (1.575 m)  Wt 291 lb (131.997 kg)  BMI 53.21 kg/m2  SpO2 99% Constitutional: VSS, see above. General Appearance: alert, well-developed, well-nourished, NAD Eyes: Normal lids and conjunctive, non-icteric sclera,  Neck: No masses, trachea midline. No thyroid enlargement/tenderness/mass appreciated Respiratory: Normal respiratory effort. no wheeze/rhonchi/rales Cardiovascular: S1/S2 normal, no murmur/rub/gallop auscultated. RRR. No lower extremity edema. Musculoskeletal: Gait normal. No clubbing/cyanosis of digits.  Neurological: No cranial nerve deficit on limited exam. Motor and sensation intact and symmetric Psychiatric: Normal judgment/insight. Normal mood and affect. Oriented x3.    No results found for this or any previous visit (from the past 72 hour(s)).  EKG interpretation: Rate: 77 Rhythm: sinus No ST/T changes concerning for acute ischemia/infarct    ASSESSMENT/PLAN:  Anxiety associated with depression - Plan: citalopram (CELEXA) 20 MG tablet  Eczema - Plan: Clobetasol Prop Emollient Base (CLOBETASOL PROPIONATE E) 0.05 % emollient cream  Chest discomfort - Plan: EKG 12-Lead no concerns - ER precautions reviewed re: chest pain/SOB other concerns.   Morbid obesity, unspecified obesity type (HCC) - Plan: Lipid panel  History of hypothyroidism - Plan: TSH, levothyroxine (SYNTHROID, LEVOTHROID) 112 MCG tablet  Medication management - Plan: CBC with Differential/Platelet, COMPLETE METABOLIC PANEL WITH GFR  Elevated blood pressure - discussed followup to recheck 1 month

## 2015-05-03 LAB — LIPID PANEL
CHOL/HDL RATIO: 5.6 ratio — AB (ref <=5.0)
CHOLESTEROL: 224 mg/dL — AB (ref 125–200)
HDL: 40 mg/dL — AB (ref >=46)
TRIGLYCERIDES: 431 mg/dL — AB (ref <150)

## 2015-05-03 LAB — COMPLETE METABOLIC PANEL WITH GFR
ALT: 18 U/L (ref 6–29)
AST: 16 U/L (ref 10–30)
Albumin: 3.7 g/dL (ref 3.6–5.1)
Alkaline Phosphatase: 81 U/L (ref 33–115)
BUN: 13 mg/dL (ref 7–25)
CALCIUM: 8.8 mg/dL (ref 8.6–10.2)
CHLORIDE: 104 mmol/L (ref 98–110)
CO2: 25 mmol/L (ref 20–31)
Creat: 0.6 mg/dL (ref 0.50–1.10)
Glucose, Bld: 68 mg/dL (ref 65–99)
Potassium: 4.1 mmol/L (ref 3.5–5.3)
Sodium: 139 mmol/L (ref 135–146)
TOTAL PROTEIN: 6.6 g/dL (ref 6.1–8.1)
Total Bilirubin: 0.3 mg/dL (ref 0.2–1.2)

## 2015-05-03 LAB — T3: T3 TOTAL: 120.6 ng/dL (ref 80.0–204.0)

## 2015-05-03 LAB — TSH: TSH: 65.534 u[IU]/mL — AB (ref 0.350–4.500)

## 2015-05-03 LAB — T4, FREE: Free T4: 0.66 ng/dL — ABNORMAL LOW (ref 0.80–1.80)

## 2015-05-03 MED ORDER — ATORVASTATIN CALCIUM 20 MG PO TABS: 20.0000 mg | ORAL_TABLET | Freq: Every day | ORAL | Status: DC

## 2015-05-03 NOTE — Addendum Note (Signed)
Addended by: Deirdre PippinsALEXANDER, Marie Borowski M on: 05/03/2015 10:08 AM   Modules accepted: Orders

## 2015-05-24 ENCOUNTER — Ambulatory Visit (INDEPENDENT_AMBULATORY_CARE_PROVIDER_SITE_OTHER): Payer: BLUE CROSS/BLUE SHIELD | Admitting: Osteopathic Medicine

## 2015-05-24 ENCOUNTER — Encounter: Payer: Self-pay | Admitting: Osteopathic Medicine

## 2015-05-24 VITALS — BP 131/75 | HR 76 | Temp 98.1°F | Wt 290.6 lb

## 2015-05-24 DIAGNOSIS — J019 Acute sinusitis, unspecified: Secondary | ICD-10-CM | POA: Diagnosis not present

## 2015-05-24 DIAGNOSIS — B9689 Other specified bacterial agents as the cause of diseases classified elsewhere: Secondary | ICD-10-CM

## 2015-05-24 MED ORDER — IPRATROPIUM BROMIDE 0.03 % NA SOLN: 2.0000 | Freq: Two times a day (BID) | NASAL | Status: DC

## 2015-05-24 MED ORDER — AMOXICILLIN-POT CLAVULANATE 875-125 MG PO TABS: 1.0000 | ORAL_TABLET | Freq: Two times a day (BID) | ORAL | Status: DC

## 2015-05-24 MED ORDER — BENZONATATE 200 MG PO CAPS
200.0000 mg | ORAL_CAPSULE | Freq: Three times a day (TID) | ORAL | Status: DC | PRN
Start: 2015-05-24 — End: 2015-05-30

## 2015-05-24 NOTE — Progress Notes (Signed)
HPI: Monique SetaHeather [name] is a 39 y.o. female who [facility]  today for chief complaint of:  Chief Complaint  Patient presents with  . Sinusitis    x 1 week    . Location: sinuses . Quality: congestion . Severity: moderate . Duration:1 week . Modifying factors: no OTC meds tried . Assoc signs/symptoms: No fever/chills, (+) cough, mild sore throat    Past medical, social and family history reviewed: Past Medical History  Diagnosis Date  . Thyroid disease   . Allergy   . Anxiety   . Depression    No past surgical history on file. Social History  Substance Use Topics  . Smoking status: Never Smoker   . Smokeless tobacco: Never Used  . Alcohol Use: No   Family History  Problem Relation Age of Onset  . Cancer Mother [id number]    postmenopausal breast   . Hyperlipidemia Father   . Hypertension Father     Current Outpatient Prescriptions  Medication Sig Dispense Refill  . atorvastatin (LIPITOR) 20 MG tablet Take 1 tablet (20 mg total) by mouth daily. 90 tablet 3  . citalopram (CELEXA) 20 MG tablet Take 1 tablet (20 mg total) by mouth daily. 30 tablet 3  . Clobetasol Prop Emollient Base (CLOBETASOL PROPIONATE E) 0.05 % emollient cream APPLY TO AFFECTED AREA TWICE A DAY X4 WEEKS THEN STOP X1 WEEK 60 g 0  . fluticasone (FLONASE) 50 MCG/ACT nasal spray Place 2 sprays into the nose daily. 16 g 2  . levothyroxine (SYNTHROID, LEVOTHROID) 112 MCG tablet Take 1 tablet (112 mcg total) by mouth daily. 90 tablet 3   No current facility-administered medications for this visit.   No Known Allergies    Review of Systems: CONSTITUTIONAL:  No  fever, no chills, No  unintentional weight changes HEAD/EYES/EARS/NOSE/THROAT: No headache, no vision change, no hearing change, Yes  sore throat CARDIAC: No chest pain, no pressure/palpitations, no orthopnea RESPIRATORY: Yes  cough, No  shortness of breath/wheeze GASTROINTESTINAL: No nausea, no  vomiting, no abdominal pain, no blood in stool, no diarrhea, no constipation    Exam:  BP 131/75 mmHg  Pulse 76  Temp(Src) 98.1 F (36.7 C)  Wt 290 lb 9 oz (131.798 kg) Constitutional: VSS, see above. General Appearance: alert, well-developed, well-nourished, NAD Eyes: Normal lids and conjunctive, non-icteric sclera, PERRLA Ears, Nose, Mouth, Throat: Normal external inspection ears/nares/mouth/lips/gums, TM normal bilaterally, MMM, posterior pharynx Yes  erythema No  Exudate, (+) sinus tenderness maxillary Neck: No masses, trachea midline. No thyroid enlargement/tenderness/mass appreciated. No lymphadenopathy Respiratory: Normal respiratory effort. no wheeze, no rhonchi, no rales Cardiovascular: S1/S2 normal, no murmur, no rub/gallop auscultated. RRR.     No results found for this or any previous visit (from the past 72 hour(s)).    ASSESSMENT/PLAN:  Acute bacterial sinusitis - Plan: amoxicillin-clavulanate (AUGMENTIN) 875-125 MG tablet, benzonatate (TESSALON) 200 MG capsule, ipratropium (ATROVENT) 0.03 % nasal spray   Return sooner if symptoms worsen or fail to improve, for visit as schedule for routine care.

## 2015-05-24 NOTE — Patient Instructions (Signed)
TAKE ALL ANTIBIOTICS EVEN IF YOU START FEELING BETTER YOU'VE BEEN GIVEN PRESCRIPTIONS FOR COUGH MEDICINE AND A NASAK SPRAY TO HELP WITH THE CONGESTION AND PAIN.   OK TO TAKE BENADRYL, SUDAFED, IBUPROFEN FOR A FEW DAYS.   REMEMBER TAKE THYROID MEDICINE FIRST THING IN THE MORNING, 30 MINUTES BEFORE EATING

## 2015-05-30 ENCOUNTER — Ambulatory Visit (INDEPENDENT_AMBULATORY_CARE_PROVIDER_SITE_OTHER): Payer: BLUE CROSS/BLUE SHIELD | Admitting: Osteopathic Medicine

## 2015-05-30 ENCOUNTER — Encounter: Payer: Self-pay | Admitting: Osteopathic Medicine

## 2015-05-30 VITALS — BP 126/76 | HR 92 | Ht 62.0 in | Wt 296.0 lb

## 2015-05-30 DIAGNOSIS — E039 Hypothyroidism, unspecified: Secondary | ICD-10-CM | POA: Diagnosis not present

## 2015-05-30 DIAGNOSIS — F418 Other specified anxiety disorders: Secondary | ICD-10-CM | POA: Diagnosis not present

## 2015-05-30 DIAGNOSIS — J329 Chronic sinusitis, unspecified: Secondary | ICD-10-CM | POA: Diagnosis not present

## 2015-05-30 DIAGNOSIS — R059 Cough, unspecified: Secondary | ICD-10-CM

## 2015-05-30 DIAGNOSIS — E785 Hyperlipidemia, unspecified: Secondary | ICD-10-CM

## 2015-05-30 DIAGNOSIS — A499 Bacterial infection, unspecified: Secondary | ICD-10-CM

## 2015-05-30 DIAGNOSIS — R05 Cough: Secondary | ICD-10-CM | POA: Diagnosis not present

## 2015-05-30 DIAGNOSIS — B9689 Other specified bacterial agents as the cause of diseases classified elsewhere: Secondary | ICD-10-CM

## 2015-05-30 DIAGNOSIS — Z79899 Other long term (current) drug therapy: Secondary | ICD-10-CM

## 2015-05-30 NOTE — Patient Instructions (Signed)
DR. Redgie Grayer HOME CARE INSTRUCTIONS: UPPER RESPIRATORY ILLNESS AND SINUSITIS    TREAT SINUS CONGESTION, RUNNY NOSE & POSTNASAL DRIP: treat to increase airflow through sinuses, decrease congestion pain and prevent bacterial growth! Remember, only 0.5-2% of sinus infections are due to a bacteria, the rest are due to a virus (usually the common cold)! Trust your doctor when he or she decides whether or not you really need an antibiotic!   NASAL SPRAYS: generally safe, won't interact with other medicines. Can take any of these medications, either alone or together...  FLONASE (FLUTICASONA) - 2 sprays in each nostril twice per day  AFRIN (OXYMETOLAZONE) - use sparingly because it will cause rebound congestion, NEVER USE IN KIDS   SALINE NASAL SPRAY- no limit but avoid use after other nasal sprays  ANTIHISTAMINES: Helps dry out runny nose and decreases postnasal drip. Benadryl may cause drowsiness but other preparations should not be as sedating. Certain kinds are not as safe in elderly individuals. OK to use unless Dr A says otherwise.  Only use one of the following...  BENADRYL (DIPHENHYDRAMINE) - 25-50 mg every 6 hours  ZYRTEC (CETIRIZINE) - 5-10 mg daily  CLARITIN (LORATIDINE) - less potent. 10 mg daily  ALLEGRA (FEXOFENADINE) - least sedating! 180 mg daily or 60 mg twice per day  DECONGESTANTS:Helps dry out runny nose and helps with sinus pain. May cause insomnia, or sometimes elevated heart rate. Can cause problems if used often in people with high blood pressure. OK to use unless Dr A says otherwise.   SUDAFED (PSEUDOEPHEDINE) - 60 mg every 4 - 6 hours, also comes in 120 mg extended release every 12 hours, maximum 240 mg in 24 hours. NEVER USE IN KIDS UNDER 4 YEARS OLD.   Note: decongestants such as Sudafed and others are included in many drug combinations - read labels carefully and ask your pharmacist if you have any questions or concerns about interactions or buying duplicate  medicines!  COMBINATIONS OF ANTIHISTAMINES + DECONGESTANT: these often require you to show your ID at the pharmacy counter.   ZYRTEC-D (CETIRIZINE + PSEUDOEPHEDRINE) - 12 hour formulation as directed  CLARITIN-D (LORATIDINE + PSEUDOEPHEDRINE) - 12 and 24 hour formulations as directed  ALLEGRA-D (FEXOFENADINE + PSEUDOEPHEDRINE) - 12 and 24 hour formulations as directed  PRESCRIPTION TREATMENT Reserved for severe cases or true bacterial infection. See your medication list below! Can include nasal sprays, pills, or antibiotics in the case of true bacterial infection. Dr A may recommend certain over-the-counter medications in addition to prescriptions to help you feel better.     TREAT COUGH & SORE THROAT: Remember, cough is the body's way of protecting your airways and lungs, it's a hard-wired reflex that is tough for medicines to treat! Irritation to the airways will cause cough. This irritation is usually caused by upper airway problems like postnasal drip (treat as above) and sore throat, but in severe cases can be due to lower airway problems like bronchitis or pneumonia. Sore throat is almost always due to a virus, but occasionally can be due to Strep infection which requires antibiotics. Exercise and smoking may make cough worse - take it easy and QUIT SMOKING! Cough due to viral infection can linger for 2 weeks or so. If you're coughing longer than you think you should, or if the cough is severe, please make an appointment in the office, you may need a chest X-ray.   EXPECTORANT: Used to help clear airways, take these with PLENTY of water to help thin mucus  secretions and make the mucus easier to cough up   ROBITUSSIN (DEXTROMETHORPHAN OR GUAIFENISEN depending on formulation)   MUCINEX (GUAIFENICEN) - usually longer acting  Note: Robitussin and Mucinex come in many combinations, also - read labels carefully and ask your pharmacist if you have any questions or concerns about interactions  or buying duplicate medicines!  COUGH DROPS/LOZENGES: Whichever over-the-counter agent you prefer! Here are some suggestions for ingredients to look for...  BENZOCAINE - numbing effect, also in CHLORASEPTIC THROAT SPRAY  MENTHOL - cooling effect  HONEY: has gone head-to-head in several clinical trials with prescription cough medicines and found to be equally effective! Try 1 Teaspoon Honey + 2 Drops Lemon Juice, as much as you want to use. AVOID IN KIDS UNDER AGE 21  HERBAL TEA: There are certain ingredients which help "coat the throat" to relieve pain such as ELM BARK, LICORICE ROOT, MARSHMALLOW ROOT  PRESCRIPTION TREATMENT Reserved for severe cases. Can include pills, syrups or inhalers. See your medication list below!      TREAT ACHES AND PAINS, FEVER: Illness causes aches, pains and fever as your body increases inflammation response to help fight the infection. Rest, good hydration and nutrition, and taking anti-inflammatory medications will help. Remember: a true fever is a temperature 100.4 or higher. If you have a fever that is 105.0 or higher, that is a dangerous level and needs medical attention in the office or in the ER!     IBUPROFEN - 400-800 mg every 6 - 8 hours. Ibuprofen and similar medications can cause problems for people with heart disease or history of stomach ulcers, check with Dr A first if you're concerned. Lower doses are usually safe and effective.   TYLENOL (ACETAMINOPHEN) - [phone] mg every 4 - 6 hours. This won't cause problems with heart or stomach.   Note: Ibuprofen and Acetaminophen are often combined with many other cold medicines - read labels carefully and ask your pharmacist if you have any questions or concerns about interactions or buying duplicate medicines!     ANYTHING HIGHLIGHTED - YOU CAN TAKE THESE MEDICINES TOGETHER!     USE CAUTION - MANY OVER-THE-COUNTER MEDICATIONS COME IN COMBINATIONS OF MULTIPLE GENERICS. FOR INSTANCE, NYQUIL HAS  TYLENOL + ROBITUSSIN + AN ANTIHISTAMINE, SO BE CAREFUL YOU'RE NOT TAKING A COMBINATION MEDICINE WHICH CONTAINS MEDICATIONS YOU'RE ALSO TAKING SEPARATELY (LIKE NYQUIL SYRUP AS WELL AS TYLENOL PILLS). YOUR PHARMACIST CAN HELP YOU AVOID MEDICATION INTERACTIONS AND DUPLICATIONS - ASK FOR THEIR HELP IF YOU ARE CONFUSED OR UNSURE ABOUT WHAT TO PURCHASE OVER-THE-COUNTER!    REMEMBER - THE MOST IMPORTANT THINGS YOU CAN DO TO AVOID CATCHING OR SPREADING ILLNESS INCLUDE:   COVER YOUR COUGH WITH YOUR ARM  DISINFECT COMMONLY USED SURFACES SUCH AS TELEPHONES OR DOORKNOBS WHEN YOU OR SOMEONE LIVING OR WORKING WITH YOU IS SICK  BE SURE VACCINES ARE UP TO DATE - GET A FLU SHOT EVERY YEAR!  AND ABOVE ALL - WASH YOUR HANDS!   REMEMBER - IF YOU'RE EVER CONCERNED ABOUT MEDICATION SIDE EFFECTS, OR IF YOU'RE EVER CONCERNED YOUR SYMPTOMS ARE GETTING WORSE DESPITE TREATMENT, PLEASE CALL THE OFFICE!

## 2015-05-30 NOTE — Progress Notes (Signed)
HPI: Monique Austin is a 39 y.o. female who presents to [facility]  today for chief complaint of:  Chief Complaint  Patient presents with  . Follow-up    anxiety   ANXIETY - reports doing much much better with Celexa. Mood is improved. No SI/HI, no other complaints.   HYPOTHYROID - doing well on meds, due for repeat TSH  HYPERLIPIDEMIA - doing well on meds, repeat labs today and repeat LFT  SINUSITIS - resolving, recently treated, some cough/congestion lingering but overall better   Past medical, social and family history reviewed: Past Medical History  Diagnosis Date  . Thyroid disease   . Allergy   . Anxiety   . Depression    No past surgical history on file. Social History  Substance Use Topics  . Smoking status: Never Smoker   . Smokeless tobacco: Never Used  . Alcohol Use: No   Family History  Problem Relation Age of Onset  . Cancer Mother 72    postmenopausal breast   . Hyperlipidemia Father   . Hypertension Father     Current Outpatient Prescriptions  Medication Sig Dispense Refill  . amoxicillin-clavulanate (AUGMENTIN) 875-125 MG tablet Take 1 tablet by mouth 2 (two) times daily. 10 tablet 0  . atorvastatin (LIPITOR) 20 MG tablet Take 1 tablet (20 mg total) by mouth daily. 90 tablet 3  . benzonatate (TESSALON) 200 MG capsule Take 1 capsule (200 mg total) by mouth 3 (three) times daily as needed for cough. 30 capsule 0  . citalopram (CELEXA) 20 MG tablet Take 1 tablet (20 mg total) by mouth daily. 30 tablet 3  . Clobetasol Prop Emollient Base (CLOBETASOL PROPIONATE E) 0.05 % emollient cream APPLY TO AFFECTED AREA TWICE A DAY X4 WEEKS THEN STOP X1 WEEK 60 g 0  . fluticasone (FLONASE) 50 MCG/ACT nasal spray Place 2 sprays into the nose daily. 16 g 2  . ipratropium (ATROVENT) 0.03 % nasal spray Place 2 sprays into both nostrils every 12 (twelve) hours. 30 mL 1  . levothyroxine (SYNTHROID, LEVOTHROID) 112 MCG tablet Take 1  tablet (112 mcg total) by mouth daily. 90 tablet 3   No current facility-administered medications for this visit.   No Known Allergies    Review of Systems: CONSTITUTIONAL:  No  fever, no chills, No  unintentional weight changes HEAD/EYES/EARS/NOSE/THROAT: No headache, no vision change, no hearing change, No sore throat, (+) mild congestion CARDIAC: No chest pain RESPIRATORY: mild cough, No  shortness of breath/wheeze MUSCULOSKELETAL: No  myalgia/arthralgia ENDOCRINE: No polyuria/polydipsia/polyphagia, no heat/cold intolerance  PSYCHIATRIC: No concerns with depression, no concerns with anxiety, no sleep problems see HPI   Exam:  BP 126/76 mmHg  Pulse 92  Ht  (1.575 m)  Wt 296 lb (134.265 kg)  BMI 54.13 kg/m2 Constitutional: VSS, see above. General Appearance: alert, well-developed, well-nourished, NAD Eyes: Normal lids and conjunctive, non-icteric sclera Ears, Nose, Mouth, Throat: Normal external inspection ears/nares/mouth/lips/gums, TM normal bilaterally, MMM, posterior pharynx No  erythema No  exudate Neck: No masses, trachea midline. No thyroid enlargement/tenderness/mass appreciated. No lymphadenopathy Respiratory: Normal respiratory effort. no wheeze, no rhonchi, no rales Cardiovascular: S1/S2 normal, no murmur, no rub/gallop auscultated. RRR.  Psychiatric: Normal judgment/insight. Normal mood and affect. Oriented x3.    No results found for this or any previous visit (from the past 72 hour(s)).    ASSESSMENT/PLAN:  Anxiety associated with depression - cont Celexa, can increase dose if desired but overall symptoms are much better, will defer  to patient judgment, she is educated not to stop the medication altogether due to withdrawal concerns, but she is planning to stay on this for the forseeable future  Cough - OTC remedies discussed, info printed  Bacterial sinusitis - resolving  Hypothyroidism, unspecified hypothyroidism type - Plan: TSH  Medication  management - Plan: Lipid panel, COMPLETE METABOLIC PANEL WITH GFR, TSH  Hyperlipidemia - Plan: Lipid panel     Return in about 3 weeks (around 06/18/2015) for LAB DRAW DOWNSTAIRS - FURTHER FOLLOWUP PENDING THESE RESULTS.   HPI Review of Systems  Physical Exam

## 2015-07-02 ENCOUNTER — Telehealth: Payer: Self-pay

## 2015-07-02 DIAGNOSIS — F418 Other specified anxiety disorders: Secondary | ICD-10-CM

## 2015-07-02 MED ORDER — CITALOPRAM HYDROBROMIDE 40 MG PO TABS: 40.0000 mg | ORAL_TABLET | Freq: Every day | ORAL | Status: DC

## 2015-07-02 NOTE — Telephone Encounter (Signed)
The patient, let her know I sent in the 40 mg dose of Celexa, she can double up on the 20 mg until she is out of that, then can pick up the new prescription.

## 2015-07-02 NOTE — Telephone Encounter (Signed)
Patient states she is feeling a little bit more anxious for the past month. She would like to increase the Celexa. She states she was advised to call back if she needed an increase in her medications. Please advise.

## 2015-07-02 NOTE — Telephone Encounter (Signed)
Called patient back, finished prescription today, will pick up new one.

## 2015-07-03 ENCOUNTER — Other Ambulatory Visit: Payer: Self-pay | Admitting: Osteopathic Medicine

## 2015-07-03 DIAGNOSIS — Z8639 Personal history of other endocrine, nutritional and metabolic disease: Secondary | ICD-10-CM

## 2015-07-03 LAB — COMPLETE METABOLIC PANEL WITH GFR
ALT: 18 U/L (ref 6–29)
AST: 14 U/L (ref 10–30)
Albumin: 3.6 g/dL (ref 3.6–5.1)
Alkaline Phosphatase: 74 U/L (ref 33–115)
BILIRUBIN TOTAL: 0.4 mg/dL (ref 0.2–1.2)
BUN: 15 mg/dL (ref 7–25)
CO2: 23 mmol/L (ref 20–31)
CREATININE: 0.68 mg/dL (ref 0.50–1.10)
Calcium: 9 mg/dL (ref 8.6–10.2)
Chloride: 105 mmol/L (ref 98–110)
GFR, Est Non African American: >89 mL/min (ref >=60)
Glucose, Bld: 84 mg/dL (ref 65–99)
Potassium: 4.4 mmol/L (ref 3.5–5.3)
Sodium: 137 mmol/L (ref 135–146)
TOTAL PROTEIN: 6.4 g/dL (ref 6.1–8.1)

## 2015-07-03 LAB — TSH: TSH: 6.581 u[IU]/mL — ABNORMAL HIGH (ref 0.350–4.500)

## 2015-07-03 LAB — LIPID PANEL
Cholesterol: 169 mg/dL (ref 125–200)
HDL: 51 mg/dL (ref >=46)
LDL CALC: 85 mg/dL (ref <130)
TRIGLYCERIDES: 167 mg/dL — AB (ref <150)
Total CHOL/HDL Ratio: 3.3 Ratio (ref <=5.0)
VLDL: 33 mg/dL — ABNORMAL HIGH (ref <30)

## 2015-07-04 MED ORDER — LEVOTHYROXINE SODIUM 125 MCG PO TABS: 125.0000 ug | ORAL_TABLET | Freq: Every day | ORAL | Status: DC

## 2015-07-04 NOTE — Addendum Note (Signed)
Addended by: Deirdre PippinsALEXANDER, Ilissa Rosner M on: 07/04/2015 08:24 AM   Modules accepted: Orders

## 2015-07-04 NOTE — Progress Notes (Signed)
What would you like to increase the synthroid dose to?  Currently taking 

## 2015-09-12 ENCOUNTER — Telehealth: Payer: Self-pay | Admitting: Family Medicine

## 2015-09-12 ENCOUNTER — Other Ambulatory Visit: Payer: Self-pay | Admitting: Osteopathic Medicine

## 2015-09-12 NOTE — Telephone Encounter (Signed)
I called pt to let her know she is due for a f/u appt for anxiety meds and she states she will call back for appt time. Thanks, [employer]

## 2015-09-17 ENCOUNTER — Other Ambulatory Visit: Payer: Self-pay | Admitting: Osteopathic Medicine

## 2015-10-08 ENCOUNTER — Other Ambulatory Visit: Payer: Self-pay | Admitting: Osteopathic Medicine

## 2015-10-12 ENCOUNTER — Ambulatory Visit (INDEPENDENT_AMBULATORY_CARE_PROVIDER_SITE_OTHER): Payer: BLUE CROSS/BLUE SHIELD | Admitting: Osteopathic Medicine

## 2015-10-12 ENCOUNTER — Encounter: Payer: Self-pay | Admitting: Osteopathic Medicine

## 2015-10-12 VITALS — BP 138/86 | HR 81 | Ht 62.0 in | Wt 311.0 lb

## 2015-10-12 DIAGNOSIS — F418 Other specified anxiety disorders: Secondary | ICD-10-CM

## 2015-10-12 DIAGNOSIS — E039 Hypothyroidism, unspecified: Secondary | ICD-10-CM

## 2015-10-12 DIAGNOSIS — H60543 Acute eczematoid otitis externa, bilateral: Secondary | ICD-10-CM | POA: Diagnosis not present

## 2015-10-12 LAB — TSH: TSH: 14.27 mIU/L — ABNORMAL HIGH

## 2015-10-12 MED ORDER — BUSPIRONE HCL 10 MG PO TABS: 5.0000 mg | ORAL_TABLET | Freq: Three times a day (TID) | ORAL | Status: DC | PRN

## 2015-10-12 MED ORDER — CITALOPRAM HYDROBROMIDE 40 MG PO TABS: 40.0000 mg | ORAL_TABLET | Freq: Every day | ORAL | Status: DC

## 2015-10-12 MED ORDER — DESONIDE 0.05 % EX CREA: TOPICAL_CREAM | Freq: Two times a day (BID) | CUTANEOUS | Status: DC | PRN

## 2015-10-12 NOTE — Progress Notes (Signed)
HPI: Monique Austin is a 40 y.o. female who presents to [facility] [place] today for chief complaint of:  Chief Complaint  Patient presents with  . Follow-up    ANXIETY     ANXIETY - moods a bit better overall but anxiety high - she and fiancee are buying hose, wedding coming up in the fall, she reports more ittirable and difficulty dealing with anxiety.   SKIN - patient brings bottle of other surgery that she had been using for eczema of the ears bilaterally. Requests refill on this medication. Also reports some persistent eczema on the arms, mild, clear nasal seems to be helping.  THYROID - need to recheck labs, overdue since medication change dose.  OBESITY - Patient has noticed increased weight gain, would like to discuss weight loss options.    Past medical, social and family history reviewed: Past Medical History  Diagnosis Date  . Thyroid disease   . Allergy   . Anxiety   . Depression    No past surgical history on file. Social History  Substance Use Topics  . Smoking status: Never Smoker   . Smokeless tobacco: Never Used  . Alcohol Use: No   Family History  Problem Relation Age of Onset  . Cancer Mother 3    postmenopausal breast   . Hyperlipidemia Father   . Hypertension Father     Current Outpatient Prescriptions  Medication Sig Dispense Refill  . atorvastatin (LIPITOR) 20 MG tablet Take 1 tablet (20 mg total) by mouth daily. 90 tablet 3  . citalopram (CELEXA) 40 MG tablet Take 1 tablet (40 mg total) by mouth daily. FOLLOW UP APPOINTMENT NEEDED FOR FURTHER REFILLS 30 tablet 0  . Clobetasol Prop Emollient Base (CLOBETASOL PROPIONATE E) 0.05 % emollient cream APPLY TO AFFECTED AREA TWICE A DAY X4 WEEKS THEN STOP X1 WEEK 60 g 0  . fluticasone (FLONASE) 50 MCG/ACT nasal spray Place 2 sprays into the nose daily. 16 g 2  . ipratropium (ATROVENT) 0.03 % nasal spray Place 2 sprays into both nostrils every 12 (twelve) hours. 30 mL 1   . levothyroxine (SYNTHROID, LEVOTHROID) 125 MCG tablet Take 1 tablet (125 mcg total) by mouth daily. APPOINTMENT NEEDED WITH PCP FOR FURTHER REFILLS 30 tablet 0   No current facility-administered medications for this visit.   No Known Allergies    Review of Systems: CONSTITUTIONAL:  No  fever, no chills, (+)unintentional weight changes - gain HEAD/EYES/EARS/NOSE/THROAT: No  headache, no vision change CARDIAC: No  chest pain, RESPIRATORY: No  cough, No  shortness of breath/ GASTROINTESTINAL: No  nausea, No  vomiting, No  abdominal pain MUSCULOSKELETAL: No  myalgia/arthralgia SKIN: No  rash/wounds/concerning lesions other than eczema ENDOCRINE: No polyuria/polydipsia/polyphagia, No  heat/cold intolerance  PSYCHIATRIC: No  concerns with depression, (+) concerns with anxiety, No sleep problems  Exam:  BP 138/86 mmHg  Pulse 81  Ht  (1.575 m)  Wt 311 lb (141.069 kg)  BMI 56.87 kg/m2 Constitutional: VS see above. General Appearance: alert, well-developed, well-nourished, NAD Eyes: Normal lids and conjunctive, non-icteric sclera, Ears, Nose, Mouth, Throat: MMM, Normal external inspection ears/nares/mouth/lips/gums,  Neck: No masses, trachea midline.  Respiratory: Normal respiratory effort. Cardiovascular: S1/S2 normal, no murmur, no rub/gallop auscultated. RRR. No lower extremity edema.  Musculoskeletal: Gait normal. No clubbing/cyanosis of digits.  Neurological: No cranial nerve deficit on limited exam. Motor and sensation intact and symmetric Skin: warm, dry, intact. No rash/ulcer. No concerning nevi or subq nodules on limited exam. Mild  eczema on elbows  Psychiatric: Normal judgment/insight. Normal mood and affect. Oriented x3.    No results found for this or any previous visit (from the past 72 hour(s)).    ASSESSMENT/PLAN: buspirone to Celexa, do this once control anxiety, advised patient can titrate up on dose will start low to see if side effects are bothersome. Refill  desonide cream for eczema of ears. Plan to repeat TSH for thyroid. Discussion of weight loss medication options and other strategies will require a longer office visit, patient advised to follow-up in one week to discuss this as well as lab results.   Anxiety associated with depression - Plan: citalopram (CELEXA) 40 MG tablet, busPIRone (BUSPAR) 10 MG tablet  Eczema of both external ears - Plan: desonide (DESOWEN) 0.05 % cream  Hypothyroidism, unspecified hypothyroidism type - Plan: TSH   Return in about 1 week (around 10/19/2015) for WEIGHT LOSS DISCUSSION AND REVIEW LABS.

## 2015-10-15 MED ORDER — LEVOTHYROXINE SODIUM 150 MCG PO TABS: 150.0000 ug | ORAL_TABLET | Freq: Every day | ORAL | Status: DC

## 2015-10-15 NOTE — Addendum Note (Signed)
Addended by: Deirdre PippinsALEXANDER, Khady Vandenberg M on: 10/15/2015 08:38 AM   Modules accepted: Orders

## 2015-10-21 ENCOUNTER — Other Ambulatory Visit: Payer: Self-pay | Admitting: Family Medicine

## 2015-11-02 ENCOUNTER — Other Ambulatory Visit: Payer: Self-pay | Admitting: Osteopathic Medicine

## 2015-11-28 ENCOUNTER — Telehealth: Payer: Self-pay

## 2015-11-28 DIAGNOSIS — E039 Hypothyroidism, unspecified: Secondary | ICD-10-CM

## 2015-11-28 MED ORDER — LEVOTHYROXINE SODIUM 150 MCG PO TABS: 150.0000 ug | ORAL_TABLET | Freq: Every day | ORAL | Status: DC

## 2015-11-28 NOTE — Telephone Encounter (Signed)
Patient request refill for Levothyroxine. 150 mg[facility] and patient was advised that a follow up appointment is needed for further refills. Amando Ishikawa,CMA

## 2015-12-24 ENCOUNTER — Other Ambulatory Visit: Payer: Self-pay | Admitting: Osteopathic Medicine

## 2016-03-19 ENCOUNTER — Telehealth: Payer: Self-pay

## 2016-03-19 DIAGNOSIS — E039 Hypothyroidism, unspecified: Secondary | ICD-10-CM

## 2016-03-19 MED ORDER — LEVOTHYROXINE SODIUM 150 MCG PO TABS: 150.0000 ug | ORAL_TABLET | Freq: Every day | ORAL | 0 refills | Status: DC

## 2016-03-19 NOTE — Telephone Encounter (Signed)
Patient called requested a refill for Levothyroxine.150 mcg. Advised patient that she needed labs for TSH as well as follow up with PCP for Thyroid. I sent in 2 week supply  and ordered labs to recheck TSH levels. Advised patient that she needed to get labs done and follow up before she runs out of her 2 week supply of medication. Patient verbally understood that she needs labs done and follow up appt. Bjorn Loser. Rhonda Cunningham,CMA

## 2016-04-02 ENCOUNTER — Encounter: Payer: Self-pay | Admitting: Osteopathic Medicine

## 2016-04-02 ENCOUNTER — Ambulatory Visit (INDEPENDENT_AMBULATORY_CARE_PROVIDER_SITE_OTHER): Payer: BLUE CROSS/BLUE SHIELD | Admitting: Osteopathic Medicine

## 2016-04-02 ENCOUNTER — Other Ambulatory Visit: Payer: Self-pay | Admitting: Osteopathic Medicine

## 2016-04-02 VITALS — BP 149/97 | HR 98 | Ht 62.0 in | Wt 313.0 lb

## 2016-04-02 DIAGNOSIS — E039 Hypothyroidism, unspecified: Secondary | ICD-10-CM

## 2016-04-02 DIAGNOSIS — J069 Acute upper respiratory infection, unspecified: Secondary | ICD-10-CM | POA: Diagnosis not present

## 2016-04-02 DIAGNOSIS — F418 Other specified anxiety disorders: Secondary | ICD-10-CM | POA: Diagnosis not present

## 2016-04-02 MED ORDER — LEVOTHYROXINE SODIUM 150 MCG PO TABS: 150.0000 ug | ORAL_TABLET | Freq: Every day | ORAL | 0 refills | Status: DC

## 2016-04-02 NOTE — Progress Notes (Signed)
HPI: Monique Austin is a 40 y.o. female  who presents to [facility] today, 04/03/16,  for chief complaint of:  Chief Complaint  Patient presents with  . Follow-up    ANXIETY AND DEPRESSION    ANX/DEP - Last visit about 6 mos ago increased anxiety, increased Celexa and added Buspirone. Doing ok these medicine. She is taking buspirone as needed and doing well on this  THYROID - never got repeat TSH.   URI - dry cough, sinus pressure. Over-the-counter medications don't seem to be helping.    Past medical, surgical, social and family history reviewed: Past Medical History:  Diagnosis Date  . Allergy   . Anxiety   . Depression   . Thyroid disease    No past surgical history on file. Social History  Substance Use Topics  . Smoking status: Never Smoker  . Smokeless tobacco: Never Used  . Alcohol use No   Family History  Problem Relation Age of Onset  . Cancer Mother 25    postmenopausal breast   . Hyperlipidemia Father   . Hypertension Father      Current medication list and allergy/intolerance information reviewed:   Current Outpatient Prescriptions  Medication Sig Dispense Refill  . atorvastatin (LIPITOR) 20 MG tablet Take 1 tablet (20 mg total) by mouth daily. 90 tablet 3  . busPIRone (BUSPAR) 10 MG tablet Take 0.5-1 tablets (5-10 mg total) by mouth 3 (three) times daily as needed (anxiety). 30 tablet 2  . CLOBETASOL PROPIONATE E 0.05 % emollient cream APPLY TO AFFECTED AREA TWICE A DAY X4 WEEKS THEN STOP X1 WEEK 60 g 0  . desonide (DESOWEN) 0.05 % cream Apply topically 2 (two) times daily as needed. 60 g 0  . levothyroxine (SYNTHROID, LEVOTHROID) 150 MCG tablet Take 1 tablet (150 mcg total) by mouth daily before breakfast. Due for lab recheck 04/30/16-05/14/16 30 tablet 0  . citalopram (CELEXA) 40 MG tablet TAKE 1 TABLET (40 MG TOTAL) BY MOUTH DAILY. 30 tablet 0   No current facility-administered medications for this visit.     No Known Allergies    Review of Systems:  Constitutional:  No  fever, no chills, + recent illness. No unintentional weight changes. No significant fatigue.   HEENT: No  headache, no vision change, no hearing change, No sore throat, +sinus pressure  Cardiac: No  chest pain, No  pressure, No palpitations  Respiratory:  No  shortness of breath. +Cough  Endocrine: No cold intolerance,  No heat intolerance. No polyuria/polydipsia/polyphagia   Neurologic: No  weakness, No  dizziness  Psychiatric: No  concerns with depression, No  concerns with anxiety, No sleep problems, No mood problems  Exam:  BP (!) 149/97   Pulse 98   Ht 5\' 2"  (1.575 m)   Wt (!) 313 lb (142 kg)   BMI 57.25 kg/m   Constitutional: VS see above. General Appearance: alert, well-developed, well-nourished, NAD  Eyes: Normal lids and conjunctive, non-icteric sclera  Ears, Nose, Mouth, Throat: MMM, Normal external inspection ears/nares/mouth/lips/gums.   Neck: No masses, trachea midline.  Respiratory: Normal respiratory effort. no wheeze, no rhonchi, no rales  Cardiovascular: S1/S2 normal, no murmur, no rub/gallop auscultated. RRR.    Musculoskeletal: Gait normal.  Neurological:  Normal balance/coordination. No tremor.   Skin: warm, dry, intact. No rash/ulcer.    Psychiatric: Normal judgment/insight. Normal mood and affect. Oriented x3.      ASSESSMENT/PLAN: Need to get thyroid labs done, she's been out and then only  restarted for the past 2 weeks or so, need to be on medication for 8 weeks and then plan to recheck. Importance of routine monitoring was stressed with the patient, barriers to getting labs done include difficulty getting in here her, so she was given print out and as long as she is going to [facility] she should be able to get blood drawn. Continue current medication for anxiety/depression.  Hypothyroidism, unspecified hypothyroidism type - Plan: TSH, levothyroxine (SYNTHROID,  LEVOTHROID) 150 MCG tablet  Anxiety associated with depression  Viral URI - Over-the-counter treatments recommended/supportive care    Visit summary with medication list and pertinent instructions was printed for patient to review. All questions at time of visit were answered - patient instructed to contact office with any additional concerns. ER/RTC precautions were reviewed with the patient. Follow-up plan: No Follow-up on file.

## 2016-04-21 ENCOUNTER — Other Ambulatory Visit: Payer: Self-pay | Admitting: *Deleted

## 2016-04-21 DIAGNOSIS — F418 Other specified anxiety disorders: Secondary | ICD-10-CM

## 2016-04-21 MED ORDER — CITALOPRAM HYDROBROMIDE 40 MG PO TABS
40.0000 mg | ORAL_TABLET | Freq: Every day | ORAL | 0 refills | Status: DC
Start: 2016-04-21 — End: 2016-10-28

## 2016-04-21 NOTE — Progress Notes (Signed)
Refill request for 90 day supply for citalopram

## 2016-05-29 ENCOUNTER — Other Ambulatory Visit: Payer: Self-pay | Admitting: Osteopathic Medicine

## 2016-05-29 DIAGNOSIS — E039 Hypothyroidism, unspecified: Secondary | ICD-10-CM

## 2016-09-01 DIAGNOSIS — R6889 Other general symptoms and signs: Secondary | ICD-10-CM | POA: Diagnosis not present

## 2016-10-28 ENCOUNTER — Other Ambulatory Visit: Payer: Self-pay | Admitting: Osteopathic Medicine

## 2016-10-28 ENCOUNTER — Other Ambulatory Visit: Payer: Self-pay

## 2016-10-28 DIAGNOSIS — E785 Hyperlipidemia, unspecified: Secondary | ICD-10-CM

## 2016-10-28 DIAGNOSIS — E038 Other specified hypothyroidism: Secondary | ICD-10-CM

## 2016-10-28 DIAGNOSIS — H60543 Acute eczematoid otitis externa, bilateral: Secondary | ICD-10-CM

## 2016-10-28 DIAGNOSIS — F418 Other specified anxiety disorders: Secondary | ICD-10-CM

## 2016-10-28 MED ORDER — DESONIDE 0.05 % EX CREA: TOPICAL_CREAM | Freq: Two times a day (BID) | CUTANEOUS | 0 refills | Status: DC | PRN

## 2016-10-28 MED ORDER — CLOBETASOL PROP EMOLLIENT BASE 0.05 % EX CREA: TOPICAL_CREAM | CUTANEOUS | 0 refills | Status: DC

## 2016-10-28 MED ORDER — CITALOPRAM HYDROBROMIDE 40 MG PO TABS: 40.0000 mg | ORAL_TABLET | Freq: Every day | ORAL | 0 refills | Status: DC

## 2016-10-28 MED ORDER — LEVOTHYROXINE SODIUM 150 MCG PO TABS
150.0000 ug | ORAL_TABLET | Freq: Every day | ORAL | 0 refills | Status: DC
Start: 2016-10-28 — End: 2016-11-06

## 2016-10-29 ENCOUNTER — Other Ambulatory Visit: Payer: Self-pay

## 2016-10-29 DIAGNOSIS — E785 Hyperlipidemia, unspecified: Secondary | ICD-10-CM

## 2016-11-06 ENCOUNTER — Ambulatory Visit (INDEPENDENT_AMBULATORY_CARE_PROVIDER_SITE_OTHER): Payer: BLUE CROSS/BLUE SHIELD | Admitting: Osteopathic Medicine

## 2016-11-06 ENCOUNTER — Encounter: Payer: Self-pay | Admitting: Osteopathic Medicine

## 2016-11-06 VITALS — BP 146/89 | HR 86 | Ht 62.0 in | Wt 316.0 lb

## 2016-11-06 DIAGNOSIS — R0683 Snoring: Secondary | ICD-10-CM | POA: Diagnosis not present

## 2016-11-06 DIAGNOSIS — F418 Other specified anxiety disorders: Secondary | ICD-10-CM

## 2016-11-06 DIAGNOSIS — R400 Somnolence: Secondary | ICD-10-CM | POA: Diagnosis not present

## 2016-11-06 DIAGNOSIS — F325 Major depressive disorder, single episode, in full remission: Secondary | ICD-10-CM

## 2016-11-06 DIAGNOSIS — Z6841 Body Mass Index (BMI) 40.0 and over, adult: Secondary | ICD-10-CM

## 2016-11-06 DIAGNOSIS — E039 Hypothyroidism, unspecified: Secondary | ICD-10-CM

## 2016-11-06 MED ORDER — LEVOTHYROXINE SODIUM 150 MCG PO TABS: 150.0000 ug | ORAL_TABLET | Freq: Every day | ORAL | 0 refills | Status: DC

## 2016-11-06 NOTE — Progress Notes (Signed)
HPI: Monique Austin is a 41 y.o. female  who presents to [facility] [place] today, 11/06/16,  for chief complaint of:  Chief Complaint  Patient presents with  . Anxiety  . Follow-up    thyroid    Thyroid: still hasn't gotten labs done from 03/2016. Last TSH a year ago was 14.27. Pt states has difficulty getting to this building for labs and appointments. Work is very demanding and not very forgiving of time off. Has been on synthroid for about 2 weeks.   Anxiety: same, controlled with current medications. Buspar as needed. Citalopram is helping her.   Daytime sleepiness: worse in last month, falling asleep at work, snores but sleep partner has never seen apneic episodes. No Hx sleep study in the past.   Weight loss: would like to discuss more.    Past medical history, surgical history, social history and family history reviewed.  Patient Active Problem List   Diagnosis Date Noted  . Thyroid activity decreased 05/30/2015  . Adjustment disorder with mixed anxiety and depressed mood 05/02/2015  . Anxiety associated with depression 05/02/2015  . Eczema 05/02/2015  . Hyperlipidemia 11/08/2010  . CHRONIC LYMPHOCYTIC THYROIDITIS 11/12/2009  . PSORIASIS 11/12/2009  . HYPOTHYROIDISM, PRIMARY 10/25/2009  . DEPRESSION 10/25/2009  . ALLERGIC CONJUNCTIVITIS 10/25/2009    Current medication list and allergy/intolerance information reviewed.   Current Outpatient Prescriptions on File Prior to Visit  Medication Sig Dispense Refill  . atorvastatin (LIPITOR) 20 MG tablet Take 1 tablet (20 mg total) by mouth daily. Please complete lab work. Order has been placed. 90 tablet 0  . busPIRone (BUSPAR) 10 MG tablet Take 0.5-1 tablets (5-10 mg total) by mouth 3 (three) times daily as needed (anxiety). 30 tablet 2  . citalopram (CELEXA) 40 MG tablet Take 1 tablet (40 mg total) by mouth daily. 11 tablet 0  . Clobetasol Prop Emollient Base (CLOBETASOL PROPIONATE E) 0.05 %  emollient cream APPLY TO AFFECTED AREA TWICE A DAY X4 WEEKS THEN STOP X1 WEEK 60 g 0  . desonide (DESOWEN) 0.05 % cream Apply topically 2 (two) times daily as needed. 60 g 0  . levothyroxine (SYNTHROID, LEVOTHROID) 150 MCG tablet Take 1 tablet (150 mcg total) by mouth daily before breakfast. LABS NEEDED FOR FURTHER REFILLS 11 tablet 0   No current facility-administered medications on file prior to visit.    No Known Allergies    Review of Systems:  Constitutional: No recent illness, feels well today  Cardiac: No  chest pain, No  pressure, No palpitations  Respiratory:  No  shortness of breath. No  Cough  Gastrointestinal: No  abdominal pain,   Neurologic: No  weakness, No  Dizziness  Psychiatric: No  concerns with depression, No  concerns with anxiety  Exam:  BP (!) 146/89   Pulse 86   Ht  (1.575 m)   Wt (!) 316 lb (143.3 kg)   BMI 57.80 kg/m   Constitutional: VS see above. General Appearance: alert, well-developed, well-nourished, NAD  Eyes: Normal lids and conjunctive, non-icteric sclera  Ears, Nose, Mouth, Throat: MMM, Normal external inspection ears/nares/mouth/lips/gums.  Neck: No masses, trachea midline.   Respiratory: Normal respiratory effort. no wheeze, no rhonchi, no rales  Cardiovascular: S1/S2 normal, no murmur, no rub/gallop auscultated. RRR.   Musculoskeletal: Gait normal. Symmetric and independent movement of all extremities  Neurological: Normal balance/coordination. No tremor.  Skin: warm, dry, intact.   Psychiatric: Normal judgment/insight. Normal mood and affect. Oriented x3.  GAD 7 : Generalized Anxiety Score 11/06/2016 04/02/2016 10/12/2015 05/31/2015  Nervous, Anxious, on Edge 0  Control/stop worrying 0  Worry too much - different things 0  Trouble relaxing 0  Restless 1 0 2 0  Easily annoyed or irritable Afraid - awful might happen 0  Total GAD 7 Score Anxiety Difficulty -  Somewhat difficult Somewhat difficult Somewhat difficult    Depression screen [facility] 2/9 04/02/2016 05/31/2015  Decreased Interest 0 1  Down, Depressed, Hopeless 1 0  PHQ - 2 Score 1 1  Altered sleeping 3 3  Tired, decreased energy 3 3  Change in appetite 3 1  Feeling bad or failure about yourself  2 0  Trouble concentrating 0 0  Moving slowly or fidgety/restless 0 0  Suicidal thoughts 0 0  PHQ-9 Score 12 8  Difficult doing work/chores - Somewhat difficult      ASSESSMENT/PLAN: Need labs after 6 weeks on thyroid meds. Other refills ok to do for a year.   Hypothyroidism, unspecified type - Plan: CBC with Differential/Platelet, COMPLETE METABOLIC PANEL WITH GFR, Lipid panel, TSH, levothyroxine (SYNTHROID, LEVOTHROID) 150 MCG tablet  Major depressive disorder, single episode, in remission (HCC)  BMI 50.0-59.9, adult (HCC)  Daytime somnolence - Plan: Ambulatory referral to Sleep Studies  Snoring - Plan: Ambulatory referral to Sleep Studies  Anxiety associated with depression - Plan: citalopram (CELEXA) 40 MG tablet    Patient Instructions  [facility] [place]:  [address], [address], [place]  [phone] 8-noon Sat, open at 7 M-F   Weight loss: important things to remember  It is hard work! You will have setbacks, but don't get discouraged. The goal is not short-term success, it is long-term health.   Looking at the numbers is important to track your progress and set goals, but how you are feeling and your overall health are the most important things! BMI and pounds and calories and miles logged aren't everything - they are tools to help [place] reach your goals.  You can do this!!!   Things to remember for exercise for weight loss:   Please note - I am not a certified personal trainer. I can present you with ideas and general workout goals, but an exercise program is largely up to you. Find something you can stick with, and something you enjoy!   As you progress  in your exercise regimen think about gradually increasing the following, week by week:   intensity (how strenuous is your workout)  frequency (how often you are exercising)  duration (how many minutes at a time you are exercising)  Walking for 20 minutes a day is certainly better than nothing, but more strenuous exercise will develop better cardiovascular fitness.   interval training (high-intensity alternating with low-intensity, think walk/jog rather than just walk)  muscle strengthening exercises (weight lifting, calisthenics, yoga) - this also helps prevent osteoporosis!   Things to remember for diet changes for weight loss:   Please note - I am not a certified dietician. I can present you with ideas and general diet goals, but a meal plan is largely up to you. I am happy to refer you to a dietician who can give you a detailed meal plan.  Apps/logs are crucial to track how you're eating! It's not realistic to be logging everything you eat forever, but when you're starting a  healthy eating lifestyle it's very helpful, and checking in with logs now and then helps you stick to your program!   Calorie restriction with the goal weight loss of no more than one to one and a half pounds per week.   Increase lean protein such as chicken, fish, [place].   Decrease fatty foods such as dairy, butter.   Decrease sugary foods. Avoid sugary drinks such as soda or juice.  Increase fiber found in fruit and vegetables.      Follow-up plan: Return for recheck depending on labs.  Visit summary with medication list and pertinent instructions was printed for patient to review, alert [place] if any changes needed. All questions at time of visit were answered - patient instructed to contact office with any additional concerns. ER/RTC precautions were reviewed with the patient and understanding verbalized.   Note: Total time spent 25 minutes, greater than 50% of the visit was spent face-to-face counseling  and coordinating care for the following: The primary encounter diagnosis was Hypothyroidism, unspecified type. Diagnoses of Major depressive disorder, single episode, in remission (HCC), BMI 50.0-59.9, adult (HCC), Daytime somnolence, Snoring, and Anxiety associated with depression were also pertinent to this visit.Marland Kitchen

## 2016-11-06 NOTE — Patient Instructions (Addendum)
[facility]:  [address], [address], [place]  [phone] 8-noon Sat, open at 7 M-F   Weight loss: important things to remember  It is hard work! You will have setbacks, but don't get discouraged. The goal is not short-term success, it is long-term health.   Looking at the numbers is important to track your progress and set goals, but how you are feeling and your overall health are the most important things! BMI and pounds and calories and miles logged aren't everything - they are tools to help [place] reach your goals.  You can do this!!!   Things to remember for exercise for weight loss:   Please note - I am not a certified personal trainer. I can present you with ideas and general workout goals, but an exercise program is largely up to you. Find something you can stick with, and something you enjoy!   As you progress in your exercise regimen think about gradually increasing the following, week by week:   intensity (how strenuous is your workout)  frequency (how often you are exercising)  duration (how many minutes at a time you are exercising)  Walking for 20 minutes a day is certainly better than nothing, but more strenuous exercise will develop better cardiovascular fitness.   interval training (high-intensity alternating with low-intensity, think walk/jog rather than just walk)  muscle strengthening exercises (weight lifting, calisthenics, yoga) - this also helps prevent osteoporosis!   Things to remember for diet changes for weight loss:   Please note - I am not a certified dietician. I can present you with ideas and general diet goals, but a meal plan is largely up to you. I am happy to refer you to a dietician who can give you a detailed meal plan.  Apps/logs are crucial to track how you're eating! It's not realistic to be logging everything you eat forever, but when you're starting a healthy eating lifestyle it's very helpful, and checking in with logs now  and then helps you stick to your program!   Calorie restriction with the goal weight loss of no more than one to one and a half pounds per week.   Increase lean protein such as chicken, fish, [place].   Decrease fatty foods such as dairy, butter.   Decrease sugary foods. Avoid sugary drinks such as soda or juice.  Increase fiber found in fruit and vegetables.

## 2016-11-07 ENCOUNTER — Other Ambulatory Visit: Payer: Self-pay

## 2016-11-07 DIAGNOSIS — R0683 Snoring: Secondary | ICD-10-CM

## 2016-11-07 DIAGNOSIS — R400 Somnolence: Secondary | ICD-10-CM

## 2016-11-07 MED ORDER — CITALOPRAM HYDROBROMIDE 40 MG PO TABS: 40.0000 mg | ORAL_TABLET | Freq: Every day | ORAL | 3 refills | Status: DC

## 2017-01-12 ENCOUNTER — Ambulatory Visit (HOSPITAL_BASED_OUTPATIENT_CLINIC_OR_DEPARTMENT_OTHER): Payer: BLUE CROSS/BLUE SHIELD | Attending: Osteopathic Medicine | Admitting: Internal Medicine

## 2017-01-12 VITALS — Ht 62.0 in | Wt 310.0 lb

## 2017-01-12 DIAGNOSIS — R0683 Snoring: Secondary | ICD-10-CM | POA: Diagnosis not present

## 2017-01-12 DIAGNOSIS — R400 Somnolence: Secondary | ICD-10-CM

## 2017-01-12 DIAGNOSIS — G4733 Obstructive sleep apnea (adult) (pediatric): Secondary | ICD-10-CM | POA: Diagnosis not present

## 2017-01-12 DIAGNOSIS — G4719 Other hypersomnia: Secondary | ICD-10-CM | POA: Insufficient documentation

## 2017-01-21 DIAGNOSIS — R400 Somnolence: Secondary | ICD-10-CM

## 2017-01-21 NOTE — Procedures (Signed)
   Patient Name: Monique Austin, Monique Austin: 01/13[birth year]2017 Gender: Female Austin.O.B: 1976/06/03 Age (years): 40 Referring Provider: Sunnie NielsenNatalie Austin Height (inches): 62 Interpreting Physician: Monique Austin, Monique Austin Weight (lbs): 310 RPSGT: Monique Austin, Monique Austin BMI: 57 MRN: [id number] Neck Size: 17.00 CLINICAL INFORMATION Sleep Study Type:unattended HST  Indication for sleep study: Excessive Daytime Sleepiness, Snoring  Epworth Sleepiness Score: 12  SLEEP STUDY TECHNIQUE A multi-channel overnight portable sleep study was performed. The channels recorded were: nasal airflow, thoracic respiratory movement, and oxygen saturation with a pulse oximetry. Snoring was also monitored.  MEDICATIONS Patient self administered medications include: none reported  during study.  SLEEP ARCHITECTURE Patient was studied for 440.3 minutes. The sleep efficiency was 99.2 % and the patient was supine for 70.6%. The arousal index was 0.0 per hour.  RESPIRATORY PARAMETERS The overall AHI was 9.1 per hour, with a central apnea index of 0.0 per hour.  The oxygen nadir was 78% during sleep.  CARDIAC DATA Mean heart rate during sleep was 79.7 bpm.  IMPRESSIONS - Mild obstructive sleep apnea occurred during this study (AHI = 9.1/h). - No significant central sleep apnea occurred during this study (CAI = 0.0/h). - Severe oxygen desaturation was noted during this study (Min O2 = 78%). - Patient snored  DIAGNOSIS - Obstructive Sleep Apnea (327.23 [G47.33 ICD-10])  RECOMMENDATIONS - Consider CPAP, ENT surgical evaluation or fitted oral appliance if more conservative measures such as weight loss are insufficient, based on clinical judgment. - Be careful with alcohol, sedatives and other CNS depressants that may worsen sleep apnea and disrupt normal sleep architecture. - Sleep hygiene should be reviewed to assess factors that may improve sleep quality. - Weight management and regular exercise should be  initiated or continued.  [Electronically signed] 01/21/2017 09:45 AM  Monique Duhamellinton Rosanne Wohlfarth Austin, Monique Austin Diplomate, American Board of Sleep Medicine   NPI: [id number]  Monique Austin,Monique Austin Diplomate, American Board of Sleep Medicine  ELECTRONICALLY SIGNED ON:  01/21/2017, 9:41 AM Monique Austin: [phone]   FX: [phone] ACCREDITED BY THE AMERICAN ACADEMY OF SLEEP MEDICINE

## 2017-01-22 MED ORDER — AMBULATORY NON FORMULARY MEDICATION: 99 refills | Status: DC

## 2017-01-22 NOTE — Addendum Note (Signed)
Addended by: Deirdre PippinsALEXANDER, Kaiyden Simkin M on: 01/22/2017 07:09 AM   Modules accepted: Orders

## 2017-01-22 NOTE — Progress Notes (Signed)
CPAP supplies ordered

## 2017-01-26 ENCOUNTER — Other Ambulatory Visit: Payer: Self-pay | Admitting: Osteopathic Medicine

## 2017-01-26 DIAGNOSIS — E785 Hyperlipidemia, unspecified: Secondary | ICD-10-CM

## 2017-01-29 ENCOUNTER — Other Ambulatory Visit: Payer: Self-pay

## 2017-01-29 MED ORDER — AMBULATORY NON FORMULARY MEDICATION: 99 refills | Status: DC

## 2017-02-09 ENCOUNTER — Other Ambulatory Visit: Payer: Self-pay | Admitting: Osteopathic Medicine

## 2017-02-09 DIAGNOSIS — E039 Hypothyroidism, unspecified: Secondary | ICD-10-CM

## 2017-02-12 DIAGNOSIS — G4733 Obstructive sleep apnea (adult) (pediatric): Secondary | ICD-10-CM | POA: Diagnosis not present

## 2017-03-10 DIAGNOSIS — L309 Dermatitis, unspecified: Secondary | ICD-10-CM | POA: Diagnosis not present

## 2017-03-15 DIAGNOSIS — G4733 Obstructive sleep apnea (adult) (pediatric): Secondary | ICD-10-CM | POA: Diagnosis not present

## 2017-04-15 DIAGNOSIS — G4733 Obstructive sleep apnea (adult) (pediatric): Secondary | ICD-10-CM | POA: Diagnosis not present

## 2017-05-14 ENCOUNTER — Other Ambulatory Visit: Payer: Self-pay | Admitting: Osteopathic Medicine

## 2017-05-14 DIAGNOSIS — E039 Hypothyroidism, unspecified: Secondary | ICD-10-CM

## 2017-07-03 ENCOUNTER — Other Ambulatory Visit: Payer: Self-pay | Admitting: Osteopathic Medicine

## 2017-07-04 ENCOUNTER — Other Ambulatory Visit: Payer: Self-pay | Admitting: Osteopathic Medicine

## 2017-07-04 DIAGNOSIS — E785 Hyperlipidemia, unspecified: Secondary | ICD-10-CM

## 2017-07-06 ENCOUNTER — Other Ambulatory Visit: Payer: Self-pay

## 2017-07-06 DIAGNOSIS — E785 Hyperlipidemia, unspecified: Secondary | ICD-10-CM

## 2017-09-18 ENCOUNTER — Telehealth: Payer: Self-pay

## 2017-09-18 DIAGNOSIS — E785 Hyperlipidemia, unspecified: Secondary | ICD-10-CM

## 2017-09-18 NOTE — Telephone Encounter (Signed)
[facility] requesting med refill for atorvastatin. Pt has not completed lab order. Pls advise, if appropriate.

## 2017-09-21 MED ORDER — ATORVASTATIN CALCIUM 20 MG PO TABS: 20.0000 mg | ORAL_TABLET | Freq: Every day | ORAL | 0 refills | Status: DC

## 2017-09-21 NOTE — Telephone Encounter (Signed)
Sent 30 day supply. Attempted to contact Pt, wrong number on file.

## 2017-09-21 NOTE — Telephone Encounter (Signed)
No lab results on file for this person for quite some time. I haven't seen her normal state year. Needs appointment and needs lab work done. Can send 30 day supply as needed but please call patient and let her know the plan. I can reorder labs if she would like to get them done prior to her visit.

## 2017-10-02 DIAGNOSIS — R51 Headache: Secondary | ICD-10-CM | POA: Diagnosis not present

## 2017-10-02 DIAGNOSIS — R6883 Chills (without fever): Secondary | ICD-10-CM | POA: Diagnosis not present

## 2017-10-02 DIAGNOSIS — R05 Cough: Secondary | ICD-10-CM | POA: Diagnosis not present

## 2017-11-16 ENCOUNTER — Other Ambulatory Visit: Payer: Self-pay | Admitting: Osteopathic Medicine

## 2017-11-16 DIAGNOSIS — E039 Hypothyroidism, unspecified: Secondary | ICD-10-CM

## 2017-12-04 ENCOUNTER — Other Ambulatory Visit: Payer: Self-pay | Admitting: Osteopathic Medicine

## 2017-12-04 DIAGNOSIS — F418 Other specified anxiety disorders: Secondary | ICD-10-CM

## 2017-12-04 DIAGNOSIS — E785 Hyperlipidemia, unspecified: Secondary | ICD-10-CM

## 2017-12-28 ENCOUNTER — Other Ambulatory Visit: Payer: Self-pay | Admitting: Osteopathic Medicine

## 2017-12-28 DIAGNOSIS — E785 Hyperlipidemia, unspecified: Secondary | ICD-10-CM

## 2017-12-28 DIAGNOSIS — F418 Other specified anxiety disorders: Secondary | ICD-10-CM

## 2018-02-01 DIAGNOSIS — J069 Acute upper respiratory infection, unspecified: Secondary | ICD-10-CM | POA: Diagnosis not present

## 2018-03-08 ENCOUNTER — Telehealth: Payer: Self-pay

## 2018-03-08 DIAGNOSIS — F418 Other specified anxiety disorders: Secondary | ICD-10-CM

## 2018-03-08 MED ORDER — CITALOPRAM HYDROBROMIDE 40 MG PO TABS: 40.0000 mg | ORAL_TABLET | Freq: Every day | ORAL | 0 refills | Status: DC

## 2018-03-08 NOTE — Telephone Encounter (Signed)
Okay to send 1 week worth of Citalopram

## 2018-03-08 NOTE — Telephone Encounter (Signed)
Medication sent and patient aware  

## 2018-03-08 NOTE — Telephone Encounter (Signed)
Monique Austin called for refills on Atorvastatin and citalopram. She has been scheduled for Friday. I didn't send refills due to the fact the last refill was in June for 15 days for the Citalopram and the atorvastatin. Last seen 10/2016. Please advise.

## 2018-03-12 ENCOUNTER — Ambulatory Visit (INDEPENDENT_AMBULATORY_CARE_PROVIDER_SITE_OTHER): Payer: BLUE CROSS/BLUE SHIELD | Admitting: Osteopathic Medicine

## 2018-03-12 ENCOUNTER — Encounter: Payer: Self-pay | Admitting: Osteopathic Medicine

## 2018-03-12 VITALS — BP 132/89 | HR 84 | Temp 98.6°F | Wt 316.5 lb

## 2018-03-12 DIAGNOSIS — F3342 Major depressive disorder, recurrent, in full remission: Secondary | ICD-10-CM | POA: Diagnosis not present

## 2018-03-12 DIAGNOSIS — E785 Hyperlipidemia, unspecified: Secondary | ICD-10-CM

## 2018-03-12 DIAGNOSIS — E039 Hypothyroidism, unspecified: Secondary | ICD-10-CM | POA: Diagnosis not present

## 2018-03-12 DIAGNOSIS — F418 Other specified anxiety disorders: Secondary | ICD-10-CM

## 2018-03-12 DIAGNOSIS — E669 Obesity, unspecified: Secondary | ICD-10-CM

## 2018-03-12 DIAGNOSIS — L408 Other psoriasis: Secondary | ICD-10-CM

## 2018-03-12 DIAGNOSIS — H60543 Acute eczematoid otitis externa, bilateral: Secondary | ICD-10-CM

## 2018-03-12 DIAGNOSIS — L309 Dermatitis, unspecified: Secondary | ICD-10-CM

## 2018-03-12 LAB — LIPID PANEL
Cholesterol: 250 mg/dL — ABNORMAL HIGH (ref <200)
HDL: 51 mg/dL (ref >50)
LDL Cholesterol (Calc): 158 mg/dL (calc) — ABNORMAL HIGH
NON-HDL CHOLESTEROL (CALC): 199 mg/dL — AB (ref <130)
Total CHOL/HDL Ratio: 4.9 (calc) (ref <5.0)
Triglycerides: 241 mg/dL — ABNORMAL HIGH (ref <150)

## 2018-03-12 LAB — CBC
HCT: 40.9 % (ref 35.0–45.0)
Hemoglobin: 13.4 g/dL (ref 11.7–15.5)
MCH: 28.7 pg (ref 27.0–33.0)
MCHC: 32.8 g/dL (ref 32.0–36.0)
MCV: 87.6 fL (ref 80.0–100.0)
MPV: 9.8 fL (ref 7.5–12.5)
PLATELETS: 339 10*3/uL (ref 140–400)
RBC: 4.67 10*6/uL (ref 3.80–5.10)
RDW: 13.5 % (ref 11.0–15.0)
WBC: 10.5 10*3/uL (ref 3.8–10.8)

## 2018-03-12 LAB — COMPLETE METABOLIC PANEL WITH GFR
AG RATIO: 1.4 (calc) (ref 1.0–2.5)
ALBUMIN MSPROF: 4 g/dL (ref 3.6–5.1)
ALKALINE PHOSPHATASE (APISO): 81 U/L (ref 33–115)
ALT: 21 U/L (ref 6–29)
AST: 15 U/L (ref 10–30)
BUN: 16 mg/dL (ref 7–25)
CALCIUM: 9.1 mg/dL (ref 8.6–10.2)
CO2: 25 mmol/L (ref 20–32)
Chloride: 106 mmol/L (ref 98–110)
Creat: 0.7 mg/dL (ref 0.50–1.10)
GFR, EST NON AFRICAN AMERICAN: 108 mL/min/{1.73_m2} (ref >=60)
GFR, Est African American: 126 mL/min/{1.73_m2} (ref >=60)
GLOBULIN: 2.9 g/dL (ref 1.9–3.7)
Glucose, Bld: 94 mg/dL (ref 65–99)
POTASSIUM: 4.3 mmol/L (ref 3.5–5.3)
SODIUM: 142 mmol/L (ref 135–146)
Total Bilirubin: 0.4 mg/dL (ref 0.2–1.2)
Total Protein: 6.9 g/dL (ref 6.1–8.1)

## 2018-03-12 LAB — TSH: TSH: 25.29 mIU/L — ABNORMAL HIGH

## 2018-03-12 MED ORDER — BUSPIRONE HCL 10 MG PO TABS: 5.0000 mg | ORAL_TABLET | Freq: Three times a day (TID) | ORAL | 0 refills | Status: DC | PRN

## 2018-03-12 MED ORDER — ATORVASTATIN CALCIUM 20 MG PO TABS: 20.0000 mg | ORAL_TABLET | Freq: Every day | ORAL | 1 refills | Status: DC

## 2018-03-12 MED ORDER — CLOBETASOL PROP EMOLLIENT BASE 0.05 % EX CREA: TOPICAL_CREAM | CUTANEOUS | 0 refills | Status: DC

## 2018-03-12 MED ORDER — PHENTERMINE HCL 37.5 MG PO TABS: 37.5000 mg | ORAL_TABLET | Freq: Every day | ORAL | 0 refills | Status: DC

## 2018-03-12 MED ORDER — LEVOTHYROXINE SODIUM 150 MCG PO TABS: 150.0000 ug | ORAL_TABLET | Freq: Every day | ORAL | 0 refills | Status: DC

## 2018-03-12 MED ORDER — CITALOPRAM HYDROBROMIDE 40 MG PO TABS: 40.0000 mg | ORAL_TABLET | Freq: Every day | ORAL | 1 refills | Status: DC

## 2018-03-12 NOTE — Progress Notes (Signed)
HPI: Monique Austin is a 42 y.o. female who  has a past medical history of Allergy, Anxiety, Depression, and Thyroid disease.  [facility], 03/12/18,  for chief complaint of:  Med refills, >1 year since seen in office    Hypothyroid - overdue for labs   HLD - overdue for labs   Anx/Dep - stable on medications, worse since out of Buspar    Weight -  Wants to work on diet, exercise. No calorie counting. No formal exercise program. Nervous about bariatrics. Would like to discuss meds.     Past medical history, surgical history, and family history reviewed.  Current medication list and allergy/intolerance information reviewed.   (See remainder of HPI, ROS, Phys Exam below)    ASSESSMENT/PLAN:   Hypothyroidism, unspecified type - Plan: CBC, COMPLETE METABOLIC PANEL WITH GFR, Lipid panel, TSH, levothyroxine (SYNTHROID, LEVOTHROID) 150 MCG tablet  Recurrent major depressive disorder, in full remission (HCC) - Plan: CBC, COMPLETE METABOLIC PANEL WITH GFR, Lipid panel, TSH, busPIRone (BUSPAR) 10 MG tablet, citalopram (CELEXA) 40 MG tablet  Hyperlipidemia, unspecified hyperlipidemia type - Plan: CBC, COMPLETE METABOLIC PANEL WITH GFR, Lipid panel, TSH, atorvastatin (LIPITOR) 20 MG tablet  Anxiety associated with depression - Plan: CBC, COMPLETE METABOLIC PANEL WITH GFR, Lipid panel, TSH, busPIRone (BUSPAR) 10 MG tablet, citalopram (CELEXA) 40 MG tablet  PSORIASIS - Plan: CBC, COMPLETE METABOLIC PANEL WITH GFR, Lipid panel, TSH, Clobetasol Prop Emollient Base (CLOBETASOL PROPIONATE E) 0.05 % emollient cream  Eczema, unspecified type - Plan: CBC, COMPLETE METABOLIC PANEL WITH GFR, Lipid panel, TSH, Clobetasol Prop Emollient Base (CLOBETASOL PROPIONATE E) 0.05 % emollient cream  Eczema of both external ears - Plan: Clobetasol Prop Emollient Base (CLOBETASOL PROPIONATE E) 0.05 % emollient cream  Obesity without serious  comorbidity, unspecified classification, unspecified obesity type - Plan: Amb ref to Medical Nutrition Therapy-MNT   No orders of the defined types were placed in this encounter.   Patient Instructions  Weight loss: important things to remember  It is hard work! You will have setbacks, but don't get discouraged. The goal is not short-term success, it is long-term health.   Looking at the numbers is important to track your progress and set goals, but how you are feeling and your overall health are the most important things! BMI and pounds and calories and miles logged aren't everything - they are tools to help us reach your goals.  You can do this!!!   Things to remember for exercise for weight loss:   Please note - I am not a certified personal trainer. I can present you with ideas and general workout goals, but an exercise program is largely up to you. Find something you can stick with, and something you enjoy!   As you progress in your exercise regimen think about gradually increasing the following, week by week:   intensity (how strenuous is your workout)  frequency (how often you are exercising)  duration (how many minutes at a time you are exercising)  Walking for 20 minutes a day is certainly better than nothing, but more strenuous exercise will develop better cardiovascular fitness.   interval training (high-intensity alternating with low-intensity, think walk/jog rather than just walk)  muscle strengthening exercises (weight lifting, calisthenics, yoga) - this also helps prevent osteoporosis!   Things to remember for diet changes for weight loss:   Please note - I am not a certified dietician. I can present you with ideas and general diet  goals, but a meal plan is largely up to you. I am happy to refer you to a dietician who can give you a detailed meal plan.  Apps/logs are crucial to track how you're eating! It's not realistic to be logging everything you eat forever, but  when you're starting a healthy eating lifestyle it's very helpful, and checking in with logs now and then helps you stick to your program!   Calorie restriction with the goal weight loss of no more than one to one and a half pounds per week.   Increase lean protein such as chicken, fish, [place].   Decrease fatty foods such as dairy, butter.   Decrease sugary foods. Avoid sugary drinks such as soda or juice.  Increase fiber found in fruit and vegetables.   Medications approved for long-term use for obesity  Qsymia (Phentermine and Topiramate) - good option  Saxenda (Liraglutide 3 mg/day) - injectable   Contrave (Bupropion and Naltrexone) - good option   Lorcaserin (Belviq or Belviq XR) - probably not with your other medicines  Orlistat (Xenical Rx, Alli OTC) - weird poops  Bupropion (Wellbutrin) - minimal effectiveness  I recommend that you research the above medications and see which one(s) your insurance may or may not cover: If you call your insurance, ask them specifically what medications are on their formulary that are approved for obesity treatment. They should be able to send you a list or tell you over the phone. Remember, medications aren't magic! You MUST be diligent about lifestyle changes as well!     Follow-up plan: Return in about 4 weeks (around 04/09/2018) for ANNUAL PHYSICAL, weight recheck, lab review. Sooner if needed! .                           ############################################ ############################################ ############################################ ############################################    Outpatient Encounter Medications as of 03/12/2018  Medication Sig  . AMBULATORY NON FORMULARY MEDICATION Supply ordered: CPAP and other supplies needed (headgear, cushions, filters, heated tuubing and water chamber) Dx: obstructive sleep apnea Settings: auto-titration 4-20 cmH2O  . atorvastatin (LIPITOR) 20 MG  tablet Take 1 tablet (20 mg total) by mouth daily. APPOINTMENT REQUIRED. Please complete lab work. Order has been placed.  Marland Kitchen atorvastatin (LIPITOR) 20 MG tablet TAKE 1 TABLET (20 MG TOTAL) BY MOUTH DAILY. PT NEEDS F/U APPT W/PCP FOR REFILLS.  Marland Kitchen busPIRone (BUSPAR) 10 MG tablet Take 0.5-1 tablets (5-10 mg total) by mouth 3 (three) times daily as needed (anxiety).  . citalopram (CELEXA) 40 MG tablet Take 1 tablet (40 mg total) by mouth daily. Pt needs f/u appt w/PCP for refills.  . Clobetasol Prop Emollient Base (CLOBETASOL PROPIONATE E) 0.05 % emollient cream APPLY TO AFFECTED AREA TWICE A DAY X4 WEEKS THEN STOP X1 WEEK  . desonide (DESOWEN) 0.05 % cream Apply topically 2 (two) times daily as needed.  Marland Kitchen levothyroxine (SYNTHROID, LEVOTHROID) 150 MCG tablet Take 1 tablet (150 mcg total) by mouth daily before breakfast. LABS NEEDED FOR FURTHER REFILLS  . levothyroxine (SYNTHROID, LEVOTHROID) 150 MCG tablet TAKE 1 TABLET (150 MCG TOTAL) BY MOUTH DAILY BEFORE BREAKFAST. LABS NEEDED FOR FURTHER REFILLS   No facility-administered encounter medications on file as of 03/12/2018.    No Known Allergies    Review of Systems:  Constitutional: No recent illness  Cardiac: No  chest pain, No  pressure, No palpitations  Respiratory:  No  shortness of breath. No  Cough  Gastrointestinal: No  abdominal pain  Musculoskeletal: No new myalgia/arthralgia  Skin: No  Rash  Neurologic: No  weakness, No  Dizziness   Exam:  BP 132/89 (BP Location: Left Arm, Patient Position: Sitting, Cuff Size: Large)   Pulse 84   Temp 98.6 F (37 C) (Oral)   Wt (!) 316 lb 8 oz (143.6 kg)   BMI 57.89 kg/m   Constitutional: VS see above. General Appearance: alert, well-developed, well-nourished, NAD  Eyes: Normal lids and conjunctive, non-icteric sclera  Ears, Nose, Mouth, Throat: MMM, Normal external inspection ears/nares/mouth/lips/gums.  Neck: No masses, trachea midline.   Respiratory: Normal respiratory effort.  no wheeze, no rhonchi, no rales  Cardiovascular: S1/S2 normal, no murmur, no rub/gallop auscultated. RRR.   Musculoskeletal: Gait normal. Symmetric and independent movement of all extremities  Neurological: Normal balance/coordination. No tremor.  Skin: warm, dry, intact.   Psychiatric: Normal judgment/insight. Normal mood and affect. Oriented x3.   Visit summary with medication list and pertinent instructions was printed for patient to review, advised to alert [place] if any changes needed. All questions at time of visit were answered - patient instructed to contact office with any additional concerns. ER/RTC precautions were reviewed with the patient and understanding verbalized.   Follow-up plan: Return in about 4 weeks (around 04/09/2018) for ANNUAL PHYSICAL, weight recheck, lab review. Sooner if needed! .    Please note: voice recognition software was used to produce this document, and typos may escape review. Please contact Dr. Lyn Hollingshead for any needed clarifications.

## 2018-03-12 NOTE — Patient Instructions (Signed)
Weight loss: important things to remember  It is hard work! You will have setbacks, but don't get discouraged. The goal is not short-term success, it is long-term health.   Looking at the numbers is important to track your progress and set goals, but how you are feeling and your overall health are the most important things! BMI and pounds and calories and miles logged aren't everything - they are tools to help us reach your goals.  You can do this!!!   Things to remember for exercise for weight loss:   Please note - I am not a certified personal trainer. I can present you with ideas and general workout goals, but an exercise program is largely up to you. Find something you can stick with, and something you enjoy!   As you progress in your exercise regimen think about gradually increasing the following, week by week:   intensity (how strenuous is your workout)  frequency (how often you are exercising)  duration (how many minutes at a time you are exercising)  Walking for 20 minutes a day is certainly better than nothing, but more strenuous exercise will develop better cardiovascular fitness.   interval training (high-intensity alternating with low-intensity, think walk/jog rather than just walk)  muscle strengthening exercises (weight lifting, calisthenics, yoga) - this also helps prevent osteoporosis!   Things to remember for diet changes for weight loss:   Please note - I am not a certified dietician. I can present you with ideas and general diet goals, but a meal plan is largely up to you. I am happy to refer you to a dietician who can give you a detailed meal plan.  Apps/logs are crucial to track how you're eating! It's not realistic to be logging everything you eat forever, but when you're starting a healthy eating lifestyle it's very helpful, and checking in with logs now and then helps you stick to your program!   Calorie restriction with the goal weight loss of no more than one  to one and a half pounds per week.   Increase lean protein such as chicken, fish, [place].   Decrease fatty foods such as dairy, butter.   Decrease sugary foods. Avoid sugary drinks such as soda or juice.  Increase fiber found in fruit and vegetables.   Medications approved for long-term use for obesity  Qsymia (Phentermine and Topiramate) - good option  Saxenda (Liraglutide 3 mg/day) - injectable   Contrave (Bupropion and Naltrexone) - good option   Lorcaserin (Belviq or Belviq XR) - probably not with your other medicines  Orlistat (Xenical Rx, Alli OTC) - weird poops  Bupropion (Wellbutrin) - minimal effectiveness  I recommend that you research the above medications and see which one(s) your insurance may or may not cover: If you call your insurance, ask them specifically what medications are on their formulary that are approved for obesity treatment. They should be able to send you a list or tell you over the phone. Remember, medications aren't magic! You MUST be diligent about lifestyle changes as well!

## 2018-03-13 ENCOUNTER — Other Ambulatory Visit: Payer: Self-pay | Admitting: Osteopathic Medicine

## 2018-03-13 DIAGNOSIS — E039 Hypothyroidism, unspecified: Secondary | ICD-10-CM

## 2018-03-13 MED ORDER — LEVOTHYROXINE SODIUM 175 MCG PO TABS: 175.0000 ug | ORAL_TABLET | Freq: Every day | ORAL | 0 refills | Status: DC

## 2018-03-13 NOTE — Progress Notes (Signed)
Adjust thyroid

## 2018-03-20 ENCOUNTER — Other Ambulatory Visit: Payer: Self-pay | Admitting: Osteopathic Medicine

## 2018-03-20 DIAGNOSIS — F418 Other specified anxiety disorders: Secondary | ICD-10-CM

## 2018-03-20 DIAGNOSIS — F3342 Major depressive disorder, recurrent, in full remission: Secondary | ICD-10-CM

## 2018-03-24 ENCOUNTER — Other Ambulatory Visit: Payer: Self-pay | Admitting: Osteopathic Medicine

## 2018-03-24 DIAGNOSIS — F418 Other specified anxiety disorders: Secondary | ICD-10-CM

## 2018-03-24 DIAGNOSIS — F3342 Major depressive disorder, recurrent, in full remission: Secondary | ICD-10-CM

## 2018-04-10 ENCOUNTER — Other Ambulatory Visit: Payer: Self-pay | Admitting: Osteopathic Medicine

## 2018-04-10 DIAGNOSIS — E039 Hypothyroidism, unspecified: Secondary | ICD-10-CM

## 2018-04-12 ENCOUNTER — Ambulatory Visit: Payer: BLUE CROSS/BLUE SHIELD | Admitting: Osteopathic Medicine

## 2018-04-12 NOTE — Telephone Encounter (Addendum)
[facility]. As per provider's last note - pt needs to complete thyroid check before next refill. Rx was changed to 150 mcg to 175 mcg. Pt is aware thyroid check due. Pls place a lab order for pt. Thanks.

## 2018-04-13 ENCOUNTER — Other Ambulatory Visit: Payer: Self-pay | Admitting: Family Medicine

## 2018-04-13 DIAGNOSIS — E039 Hypothyroidism, unspecified: Secondary | ICD-10-CM

## 2018-04-13 NOTE — Telephone Encounter (Signed)
Levothyroxine refilled.  TSH ordered.

## 2018-04-13 NOTE — Telephone Encounter (Signed)
Noted. Left detailed vm msg for pt regarding lab order/med RF sent to pharmacy.

## 2018-04-15 ENCOUNTER — Other Ambulatory Visit: Payer: Self-pay | Admitting: Osteopathic Medicine

## 2018-05-03 ENCOUNTER — Ambulatory Visit: Payer: BLUE CROSS/BLUE SHIELD | Admitting: Osteopathic Medicine

## 2018-05-08 ENCOUNTER — Other Ambulatory Visit: Payer: Self-pay | Admitting: Family Medicine

## 2018-05-08 DIAGNOSIS — E039 Hypothyroidism, unspecified: Secondary | ICD-10-CM

## 2018-05-20 ENCOUNTER — Ambulatory Visit (INDEPENDENT_AMBULATORY_CARE_PROVIDER_SITE_OTHER): Payer: BLUE CROSS/BLUE SHIELD | Admitting: Osteopathic Medicine

## 2018-05-20 ENCOUNTER — Encounter: Payer: Self-pay | Admitting: Osteopathic Medicine

## 2018-05-20 ENCOUNTER — Other Ambulatory Visit (HOSPITAL_COMMUNITY)
Admission: RE | Admit: 2018-05-20 | Discharge: 2018-05-20 | Disposition: A | Discharge status: Home / Self Care | Payer: BLUE CROSS/BLUE SHIELD | Source: Ambulatory Visit | Attending: Osteopathic Medicine | Admitting: Osteopathic Medicine

## 2018-05-20 VITALS — BP 144/81 | HR 89 | Temp 98.6°F | Wt 309.5 lb

## 2018-05-20 DIAGNOSIS — Z1239 Encounter for other screening for malignant neoplasm of breast: Secondary | ICD-10-CM

## 2018-05-20 DIAGNOSIS — E039 Hypothyroidism, unspecified: Secondary | ICD-10-CM | POA: Diagnosis not present

## 2018-05-20 DIAGNOSIS — Z124 Encounter for screening for malignant neoplasm of cervix: Secondary | ICD-10-CM | POA: Insufficient documentation

## 2018-05-20 DIAGNOSIS — Z113 Encounter for screening for infections with a predominantly sexual mode of transmission: Secondary | ICD-10-CM | POA: Diagnosis not present

## 2018-05-20 DIAGNOSIS — Z0000 Encounter for general adult medical examination without abnormal findings: Secondary | ICD-10-CM

## 2018-05-20 MED ORDER — PHENTERMINE HCL 37.5 MG PO TABS: 37.5000 mg | ORAL_TABLET | Freq: Every day | ORAL | 0 refills | Status: DC

## 2018-05-20 NOTE — Patient Instructions (Signed)
General Preventive Care  Most recent routine screening lipids/other labs: ordered previously. Cholesterol and Diabetes screening usually recommended annually. Cholesterol could use some work!   Everyone should have blood pressure checked once per year.   Tobacco: don't! Alcohol: responsible moderation is ok for most adults - if you have concerns about your alcohol intake, please talk to me! Recreational/Illicit Drugs: don't!  Exercise: as tolerated to reduce risk of cardiovascular disease and diabetes. Strength training will also prevent osteoporosis.   Mental health: if need for mental health care (medicines, counseling, other), or concerns about moods, please let me know!   Sexual health: if need for STD testing, or if concerns with libido/pain problems, please let me know! If you need to discuss your birth control options, please let me know!  Vaccines  Flu vaccine: recommended for almost everyone, every fall (by Halloween! Flu is scary!), especially if you are pregnant, if you have exposure to the public, if you're around young children or the elderly, or if you're around pregnant people.   Shingles vaccine: Shingrix recommended after age 67   Pneumonia vaccines: Prevnar and Pneumovax recommended after age 41, sooner if diabetes, COPD/asthma, others  Tetanus booster: Tdap recommended every 10 years Cancer screenings   Colon cancer screening: recommended for everyone at age 86, but some folks need a colonoscopy sooner if risk factors   Breast cancer screening: mammogram recommended at age 33 every other year at least, and annually after age 32.   Cervical cancer screening: every 1 to 5 years depending on age and other risk factors. Can stop at age 52 or w/ hysterectomy as long as previous testing was normal.  Infection screenings . HIV: recommended screening at least once age 76-65, more often if risk factors  . Gonorrhea/Chlamydia: screening as needed, though many insurances  require testing for anyone on birth control pills . Hepatitis C: recommended for anyone born 74-1964 . TB: certain at-risk populations, or depending on work requirements and/or travel history Other . Bone Density Test: recommended for women at age 32, sooner depending on risk factors . Advanced Directive: Living Will and/or Healthcare Power of Attorney recommended for all adults, regardless of age or health!

## 2018-05-20 NOTE — Progress Notes (Signed)
HPI: Monique Austin is a 42 y.o. female who  has a past medical history of Allergy, Anxiety, Depression, and Thyroid disease.  she presents to [facility] today, 05/20/18,  for chief complaint of: Annual Physical   Patient here for annual physical / wellness exam.  See preventive care reviewed as below.  Recent labs reviewed in detail with the patient.   Additional concerns today include:  Would like to continue phentermine, bit of a break when not employed     Past medical, surgical, social and family history reviewed:  Patient Active Problem List   Diagnosis Date Noted  . Thyroid activity decreased 05/30/2015  . Anxiety associated with depression 05/02/2015  . Eczema 05/02/2015  . Hyperlipidemia 11/08/2010  . CHRONIC LYMPHOCYTIC THYROIDITIS 11/12/2009  . PSORIASIS 11/12/2009  . Hypothyroidism 10/25/2009  . Major depressive disorder 10/25/2009   No past surgical history on file.  Social History   Tobacco Use  . Smoking status: Never Smoker  . Smokeless tobacco: Never Used  Substance Use Topics  . Alcohol use: No    Family History  Problem Relation Age of Onset  . Cancer Mother 70       postmenopausal breast   . Hyperlipidemia Father   . Hypertension Father      Current medication list and allergy/intolerance information reviewed:    Current Outpatient Medications  Medication Sig Dispense Refill  . AMBULATORY NON FORMULARY MEDICATION Supply ordered: CPAP and other supplies needed (headgear, cushions, filters, heated tuubing and water chamber) Dx: obstructive sleep apnea Settings: auto-titration 4-20 cmH2O 1 Units prn  . atorvastatin (LIPITOR) 20 MG tablet Take 1 tablet (20 mg total) by mouth daily. 90 tablet 1  . busPIRone (BUSPAR) 10 MG tablet Take 0.5-1 tablets (5-10 mg total) by mouth 3 (three) times daily as needed (anxiety). 90 tablet 0  . citalopram (CELEXA) 40 MG tablet Take 1 tablet (40 mg total) by mouth daily. 90  tablet 1  . Clobetasol Prop Emollient Base (CLOBETASOL PROPIONATE E) 0.05 % emollient cream APPLY TO AFFECTED AREA TWICE A DAY X4 WEEKS THEN STOP X1 WEEK 60 g 0  . desonide (DESOWEN) 0.05 % cream Apply topically 2 (two) times daily as needed. 60 g 0  . levothyroxine (SYNTHROID, LEVOTHROID) 175 MCG tablet TAKE 1 TABLET (175 MCG TOTAL) BY MOUTH DAILY BEFORE BREAKFAST. LABS DUE WHEN THIS PILL BOTTLE LOW 30 tablet 1  . phentermine (ADIPEX-P) 37.5 MG tablet Take 1 tablet (37.5 mg total) by mouth daily before breakfast. 30 tablet 0   No current facility-administered medications for this visit.     No Known Allergies    Review of Systems:  Constitutional:  No  fever, no chills, No recent illness, No unintentional weight changes. No significant fatigue.   HEENT: No  headache  Cardiac: No  chest pain, No  pressure, No palpitations, No  Orthopnea  Respiratory:  No  shortness of breath. No  Cough  Gastrointestinal: No  abdominal pain, No  nausea, No  vomiting,  No  blood in stool, No  diarrhea, No  constipation   Musculoskeletal: No new myalgia/arthralgia  Skin: No  Rash, No other wounds/concerning lesions  Genitourinary: No  incontinence, No  abnormal genital bleeding, No abnormal genital discharge  Neurologic: No  weakness, No  dizziness  Psychiatric: No  concerns with depression, No  concerns with anxiety, No sleep problems, No mood problems  Exam:  BP (!) 144/81 (BP Location: Left Arm, Patient Position: Sitting, Cuff Size:  Large)   Pulse 89   Temp 98.6 F (37 C) (Oral)   Wt (!) 309 lb 8 oz (140.4 kg)   BMI 56.61 kg/m   Constitutional: VS see above. General Appearance: alert, well-developed, well-nourished, NAD  Eyes: Normal lids and conjunctive, non-icteric sclera  Ears, Nose, Mouth, Throat: MMM, Normal external inspection ears/nares/mouth/lips/gums.   Neck: No masses, trachea midline. No thyroid enlargement. No tenderness/mass appreciated. No lymphadenopathy  Respiratory:  Normal respiratory effort. no wheeze, no rhonchi, no rales  Cardiovascular: S1/S2 normal, no murmur, no rub/gallop auscultated. RRR. No lower extremity edema.aly, no splenomegaly. No hernia appreciated. Bowel sounds normal. Rectal exam deferred.   Musculoskeletal: Gait normal. No clubbing/cyanosis of digits.   Neurological: Normal balance/coordination. No tremor. No cranial nerve deficit on limited exam. Motor and sensation intact and symmetric. Cerebellar reflexes intact.   Skin: warm, dry, intact. No rash/ulcer. No concerning nevi or subq nodules on limited exam.    Psychiatric: Normal judgment/insight. Normal mood and affect. Oriented x3.  GYN: No lesions/ulcers to external genitalia, normal urethra, normal vaginal mucosa, physiologic discharge, cervix normal without lesions, uterus not enlarged or tender, adnexa no masses and nontender  BREAST: No rashes/skin changes, normal fibrous breast tissue, no masses or tenderness, normal nipple without discharge, normal axilla    No results found for this or any previous visit (from the past 72 hour(s)).  No results found.   ASSESSMENT/PLAN:   Annual physical exam  Screening for STDs (sexually transmitted diseases) - Plan: WET PREP FOR TRICH, YEAST, CLUE, C. trachomatis/N. gonorrhoeae RNA, HIV Antibody (routine testing w rflx), RPR, Hepatitis C antibody  Cervical cancer screening - Plan: Cytology - PAP  Breast cancer screening - Plan: MM 3D SCREEN BREAST BILATERAL  Hypothyroidism, unspecified type - Plan: TSH   There is no immunization history for the selected administration types on file for this patient.  The 10-year ASCVD risk score Denman George DC Montez Hageman., et al., 2013) is: 1.4%   Values used to calculate the score:     Age: 82 years     Sex: Female     Is Non-Hispanic African American: No     Diabetic: No     Tobacco smoker: No     Systolic Blood Pressure: 144 mmHg     Is BP treated: No     HDL Cholesterol: 51 mg/dL     Total  Cholesterol: 250 mg/dL  Patient Instructions  General Preventive Care  Most recent routine screening lipids/other labs: ordered previously. Cholesterol and Diabetes screening usually recommended annually. Cholesterol could use some work!   Everyone should have blood pressure checked once per year.   Tobacco: don't! Alcohol: responsible moderation is ok for most adults - if you have concerns about your alcohol intake, please talk to me! Recreational/Illicit Drugs: don't!  Exercise: as tolerated to reduce risk of cardiovascular disease and diabetes. Strength training will also prevent osteoporosis.   Mental health: if need for mental health care (medicines, counseling, other), or concerns about moods, please let me know!   Sexual health: if need for STD testing, or if concerns with libido/pain problems, please let me know! If you need to discuss your birth control options, please let me know!  Vaccines  Flu vaccine: recommended for almost everyone, every fall (by Halloween! Flu is scary!), especially if you are pregnant, if you have exposure to the public, if you're around young children or the elderly, or if you're around pregnant people.   Shingles vaccine: Shingrix recommended after age 54  Pneumonia vaccines: Prevnar and Pneumovax recommended after age 35, sooner if diabetes, COPD/asthma, others  Tetanus booster: Tdap recommended every 10 years Cancer screenings   Colon cancer screening: recommended for everyone at age 70, but some folks need a colonoscopy sooner if risk factors   Breast cancer screening: mammogram recommended at age 59 every other year at least, and annually after age 64.   Cervical cancer screening: every 1 to 5 years depending on age and other risk factors. Can stop at age 23 or w/ hysterectomy as long as previous testing was normal.  Infection screenings . HIV: recommended screening at least once age 59-65, more often if risk factors  . Gonorrhea/Chlamydia:  screening as needed, though many insurances require testing for anyone on birth control pills . Hepatitis C: recommended for anyone born 07-1962 . TB: certain at-risk populations, or depending on work requirements and/or travel history Other . Bone Density Test: recommended for women at age 87, sooner depending on risk factors . Advanced Directive: Living Will and/or Healthcare Power of Attorney recommended for all adults, regardless of age or health!    Visit summary with medication list and pertinent instructions was printed for patient to review. All questions at time of visit were answered - patient instructed to contact office with any additional concerns. ER/RTC precautions were reviewed with the patient.   Follow-up plan: Return for LABS TODAY, recheck weight in 4 weeks. Annual physical in one year .     Please note: voice recognition software was used to produce this document, and typos may escape review. Please contact Dr. Lyn Hollingshead for any needed clarifications.

## 2018-05-21 LAB — HIV ANTIBODY (ROUTINE TESTING W REFLEX): HIV: NONREACTIVE

## 2018-05-21 LAB — C. TRACHOMATIS/N. GONORRHOEAE RNA
C. trachomatis RNA, TMA: NOT DETECTED
N. gonorrhoeae RNA, TMA: NOT DETECTED

## 2018-05-21 LAB — WET PREP FOR TRICH, YEAST, CLUE
MICRO NUMBER:: 91311885
Specimen Quality: ADEQUATE

## 2018-05-21 LAB — HEPATITIS C ANTIBODY
Hepatitis C Ab: NONREACTIVE
SIGNAL TO CUT-OFF: 0.03 (ref <1.00)

## 2018-05-21 LAB — TSH: TSH: 6.84 m[IU]/L — AB

## 2018-05-21 LAB — RPR: RPR Ser Ql: NONREACTIVE

## 2018-05-21 MED ORDER — LEVOTHYROXINE SODIUM 200 MCG PO TABS: 200.0000 ug | ORAL_TABLET | Freq: Every day | ORAL | 0 refills | Status: DC

## 2018-05-21 NOTE — Addendum Note (Signed)
Addended by: Deirdre Pippins on: 05/21/2018 10:26 AM   Modules accepted: Orders

## 2018-05-25 LAB — CYTOLOGY - PAP
Diagnosis: NEGATIVE
HPV: NOT DETECTED

## 2018-06-09 ENCOUNTER — Ambulatory Visit: Payer: BLUE CROSS/BLUE SHIELD

## 2018-06-14 ENCOUNTER — Ambulatory Visit: Payer: BLUE CROSS/BLUE SHIELD | Admitting: Osteopathic Medicine

## 2018-06-23 ENCOUNTER — Ambulatory Visit: Payer: BLUE CROSS/BLUE SHIELD | Admitting: Osteopathic Medicine

## 2018-06-30 ENCOUNTER — Ambulatory Visit: Payer: BLUE CROSS/BLUE SHIELD

## 2018-07-01 ENCOUNTER — Ambulatory Visit (INDEPENDENT_AMBULATORY_CARE_PROVIDER_SITE_OTHER): Payer: BLUE CROSS/BLUE SHIELD

## 2018-07-01 DIAGNOSIS — R928 Other abnormal and inconclusive findings on diagnostic imaging of breast: Secondary | ICD-10-CM

## 2018-07-01 DIAGNOSIS — Z1231 Encounter for screening mammogram for malignant neoplasm of breast: Secondary | ICD-10-CM | POA: Diagnosis not present

## 2018-07-05 ENCOUNTER — Other Ambulatory Visit: Payer: Self-pay | Admitting: Osteopathic Medicine

## 2018-07-05 DIAGNOSIS — R928 Other abnormal and inconclusive findings on diagnostic imaging of breast: Secondary | ICD-10-CM

## 2018-07-05 DIAGNOSIS — H60543 Acute eczematoid otitis externa, bilateral: Secondary | ICD-10-CM

## 2018-07-07 ENCOUNTER — Ambulatory Visit
Admission: RE | Admit: 2018-07-07 | Discharge: 2018-07-07 | Disposition: A | Discharge status: Home / Self Care | Payer: BLUE CROSS/BLUE SHIELD | Source: Ambulatory Visit | Attending: Osteopathic Medicine | Admitting: Osteopathic Medicine

## 2018-07-07 ENCOUNTER — Other Ambulatory Visit: Payer: Self-pay | Admitting: Osteopathic Medicine

## 2018-07-07 DIAGNOSIS — R928 Other abnormal and inconclusive findings on diagnostic imaging of breast: Secondary | ICD-10-CM

## 2018-07-07 DIAGNOSIS — R921 Mammographic calcification found on diagnostic imaging of breast: Secondary | ICD-10-CM | POA: Diagnosis not present

## 2018-07-12 ENCOUNTER — Other Ambulatory Visit: Payer: Self-pay | Admitting: Osteopathic Medicine

## 2018-07-12 DIAGNOSIS — E039 Hypothyroidism, unspecified: Secondary | ICD-10-CM

## 2018-07-26 ENCOUNTER — Other Ambulatory Visit: Payer: Self-pay | Admitting: Osteopathic Medicine

## 2018-07-26 DIAGNOSIS — E039 Hypothyroidism, unspecified: Secondary | ICD-10-CM

## 2018-07-27 NOTE — Telephone Encounter (Signed)
[facility]ine. Pt has not completed labs for thyroid check. Left pt a detailed vm msg that thyroid check is needed before refill is sent to pharmacy. Pls advise, thanks.

## 2018-07-27 NOTE — Telephone Encounter (Signed)
Agree needs labs, orders are in

## 2018-07-28 ENCOUNTER — Other Ambulatory Visit: Payer: Self-pay | Admitting: Osteopathic Medicine

## 2018-07-28 DIAGNOSIS — E039 Hypothyroidism, unspecified: Secondary | ICD-10-CM

## 2018-07-28 NOTE — Telephone Encounter (Signed)
Requested medication (s) are due for refill today: no  Requested medication (s) are on the active medication list: yes  Last refill:  07/12/18  Future visit scheduled: no  Notes to clinic:  Pt wants a 90 day prescription-- pt is due for blood work    Requested Prescriptions  Pending Prescriptions Disp Refills   levothyroxine (SYNTHROID, LEVOTHROID) 200 MCG tablet [Pharmacy Med Name: LEVOTHYROXINE 200 MCG TABLET] 90 tablet 1    Sig: TAKE 1 TABLET BY MOUTH DAILY BEFORE BREAKFAST. DUE FOR REPEAT BLOOD WORK WHEN PILLS ARE RUNNING LOW.     Endocrinology:  Hypothyroid Agents Failed - 07/28/2018 11:03 AM      Failed - TSH needs to be rechecked within 3 months after an abnormal result. Refill until TSH is due.      Failed - TSH in normal range and within 360 days    TSH  Date Value Ref Range Status  05/20/2018 6.84 (H) mIU/L Final    Comment:              Reference Range .           > or = 20 Years  0.40-4.50 .                Pregnancy Ranges           First trimester    0.26-2.66           Second trimester   0.55-2.73           Third trimester    0.43-2.91          Failed - Valid encounter within last 12 months    Recent Outpatient Visits          2 months ago Annual physical exam   [place] [facility], [place], [place]   4 months ago Hypothyroidism, unspecified type   [facility], Dorene Grebe, DO   1 year ago Hypothyroidism, unspecified type   [facility], Dorene Grebe, DO   2 years ago Hypothyroidism, unspecified hypothyroidism type   [facility], Dorene Grebe, DO   2 years ago Anxiety associated with depression   [facility], [place], [place]

## 2018-07-29 ENCOUNTER — Telehealth: Payer: Self-pay

## 2018-07-29 DIAGNOSIS — E039 Hypothyroidism, unspecified: Secondary | ICD-10-CM | POA: Diagnosis not present

## 2018-07-29 NOTE — Telephone Encounter (Signed)
At pt's request - lab order for thyroid check faxed to [phone]. Confirmation rec'd. Left a detailed vm msg for pt. Direct call back provided.

## 2018-07-29 NOTE — Telephone Encounter (Signed)
[facility]. Called pt, aware that thyroid check is needed since thyroid med was adjusted. Pt requests to have lab order fax to her. She did not have fax number on hand at time of call. Pt will return call back. She is aware refill will pend until thyroid results rec'd.

## 2018-07-30 ENCOUNTER — Other Ambulatory Visit: Payer: Self-pay | Admitting: Osteopathic Medicine

## 2018-07-30 DIAGNOSIS — E039 Hypothyroidism, unspecified: Secondary | ICD-10-CM

## 2018-07-30 LAB — TSH: TSH: 6.02 mIU/L — ABNORMAL HIGH

## 2018-07-30 MED ORDER — LEVOTHYROXINE SODIUM 200 MCG PO TABS: 200.0000 ug | ORAL_TABLET | Freq: Every day | ORAL | 0 refills | Status: DC

## 2018-07-30 MED ORDER — LEVOTHYROXINE SODIUM 50 MCG PO TABS: 50.0000 ug | ORAL_TABLET | Freq: Every day | ORAL | 0 refills | Status: DC

## 2018-07-30 NOTE — Telephone Encounter (Signed)
See other note. At pt's request, lab order faxed.

## 2018-09-01 DIAGNOSIS — R6889 Other general symptoms and signs: Secondary | ICD-10-CM | POA: Diagnosis not present

## 2018-11-21 ENCOUNTER — Other Ambulatory Visit: Payer: Self-pay | Admitting: Osteopathic Medicine

## 2018-11-21 DIAGNOSIS — E039 Hypothyroidism, unspecified: Secondary | ICD-10-CM

## 2018-11-21 DIAGNOSIS — E785 Hyperlipidemia, unspecified: Secondary | ICD-10-CM

## 2018-11-21 DIAGNOSIS — F418 Other specified anxiety disorders: Secondary | ICD-10-CM

## 2018-11-21 DIAGNOSIS — F3342 Major depressive disorder, recurrent, in full remission: Secondary | ICD-10-CM

## 2018-11-21 NOTE — Telephone Encounter (Signed)
Please advise 

## 2018-11-22 NOTE — Telephone Encounter (Signed)
NEEDS LABS for thyroid Orders have been in Refill sent for limited supply of thyroid meds OK to come to lab whenever doesn't have to be fasting

## 2018-11-22 NOTE — Telephone Encounter (Signed)
[facility] for levothyroxine, atorvastatin and citalopram. Last thyroid check was 07/29/18, abnl results. Contacted pt, aware that repeat thyroid check is due. Pt agrees to have blood work completed. Pt is aware thyroid med refill pending.

## 2018-11-22 NOTE — Telephone Encounter (Signed)
Pt has been updated of med refills sent to pharmacy. Pt aware thyroid check is required. She will stop by the lab sometime this week to complete order. No other inquiries during call.

## 2018-11-23 ENCOUNTER — Other Ambulatory Visit: Payer: Self-pay | Admitting: Osteopathic Medicine

## 2018-11-23 DIAGNOSIS — H60543 Acute eczematoid otitis externa, bilateral: Secondary | ICD-10-CM

## 2018-11-23 DIAGNOSIS — L309 Dermatitis, unspecified: Secondary | ICD-10-CM

## 2018-11-23 DIAGNOSIS — L408 Other psoriasis: Secondary | ICD-10-CM

## 2018-11-26 ENCOUNTER — Other Ambulatory Visit: Payer: Self-pay

## 2018-11-26 ENCOUNTER — Other Ambulatory Visit: Payer: Self-pay | Admitting: Osteopathic Medicine

## 2018-11-26 DIAGNOSIS — R921 Mammographic calcification found on diagnostic imaging of breast: Secondary | ICD-10-CM

## 2018-11-26 DIAGNOSIS — N63 Unspecified lump in unspecified breast: Secondary | ICD-10-CM

## 2018-11-26 DIAGNOSIS — E039 Hypothyroidism, unspecified: Secondary | ICD-10-CM | POA: Diagnosis not present

## 2018-11-27 LAB — TSH: TSH: 17.05 mIU/L — ABNORMAL HIGH

## 2018-11-29 ENCOUNTER — Other Ambulatory Visit: Payer: Self-pay | Admitting: Osteopathic Medicine

## 2018-11-29 DIAGNOSIS — R921 Mammographic calcification found on diagnostic imaging of breast: Secondary | ICD-10-CM

## 2018-11-29 DIAGNOSIS — E039 Hypothyroidism, unspecified: Secondary | ICD-10-CM

## 2018-11-29 MED ORDER — LEVOTHYROXINE SODIUM 200 MCG PO TABS: 200.0000 ug | ORAL_TABLET | Freq: Every day | ORAL | 0 refills | Status: DC

## 2018-11-29 MED ORDER — LEVOTHYROXINE SODIUM 50 MCG PO TABS: ORAL_TABLET | ORAL | 0 refills | Status: DC

## 2019-01-04 ENCOUNTER — Ambulatory Visit
Admission: RE | Admit: 2019-01-04 | Discharge: 2019-01-04 | Disposition: A | Discharge status: Home / Self Care | Payer: BC Managed Care – PPO | Source: Ambulatory Visit

## 2019-01-04 ENCOUNTER — Other Ambulatory Visit: Payer: Self-pay | Admitting: Osteopathic Medicine

## 2019-01-04 ENCOUNTER — Other Ambulatory Visit: Payer: Self-pay

## 2019-01-04 DIAGNOSIS — R921 Mammographic calcification found on diagnostic imaging of breast: Secondary | ICD-10-CM | POA: Diagnosis not present

## 2019-02-23 ENCOUNTER — Other Ambulatory Visit: Payer: Self-pay | Admitting: Osteopathic Medicine

## 2019-02-23 DIAGNOSIS — E039 Hypothyroidism, unspecified: Secondary | ICD-10-CM

## 2019-02-23 NOTE — Telephone Encounter (Signed)
Noted  

## 2019-02-23 NOTE — Telephone Encounter (Signed)
I sent 30 days supply

## 2019-02-23 NOTE — Telephone Encounter (Signed)
FYI - Left a detailed vm msg for pt regarding that recheck for thyroid is needed. Pt aware to have labs drawn sometime this week and that refill is pending until results are reviewed by provider. Direct call back info provided.

## 2019-03-31 DIAGNOSIS — R05 Cough: Secondary | ICD-10-CM | POA: Diagnosis not present

## 2019-04-08 DIAGNOSIS — R05 Cough: Secondary | ICD-10-CM | POA: Diagnosis not present

## 2019-04-08 DIAGNOSIS — B9689 Other specified bacterial agents as the cause of diseases classified elsewhere: Secondary | ICD-10-CM | POA: Diagnosis not present

## 2019-04-08 DIAGNOSIS — J019 Acute sinusitis, unspecified: Secondary | ICD-10-CM | POA: Diagnosis not present

## 2019-04-18 DIAGNOSIS — E039 Hypothyroidism, unspecified: Secondary | ICD-10-CM | POA: Diagnosis not present

## 2019-04-19 ENCOUNTER — Other Ambulatory Visit: Payer: Self-pay

## 2019-04-19 DIAGNOSIS — E039 Hypothyroidism, unspecified: Secondary | ICD-10-CM

## 2019-04-19 LAB — TSH+FREE T4: TSH W/REFLEX TO FT4: 3.77 mIU/L

## 2019-04-19 MED ORDER — LEVOTHYROXINE SODIUM 200 MCG PO TABS: ORAL_TABLET | ORAL | 1 refills | Status: DC

## 2019-04-19 NOTE — Progress Notes (Signed)
In normal range! Continue to take same dose. Ok to send 6 month refill and then recheck again.

## 2019-04-20 ENCOUNTER — Other Ambulatory Visit: Payer: Self-pay

## 2019-04-20 ENCOUNTER — Other Ambulatory Visit: Payer: Self-pay | Admitting: Osteopathic Medicine

## 2019-04-20 DIAGNOSIS — E039 Hypothyroidism, unspecified: Secondary | ICD-10-CM

## 2019-04-20 MED ORDER — LEVOTHYROXINE SODIUM 50 MCG PO TABS: ORAL_TABLET | ORAL | 1 refills | Status: DC

## 2019-04-20 NOTE — Telephone Encounter (Signed)
Requested medication (s) are due for refill today: yes  Requested medication (s) are on the active medication list: yes  Last refill:  02/23/2019  Future visit scheduled: no  Notes to clinic:  Requesting 90 day supply    Requested Prescriptions  Pending Prescriptions Disp Refills   levothyroxine (SYNTHROID) 50 MCG tablet [Pharmacy Med Name: LEVOTHYROXINE 50 MCG TABLET] 30 tablet 0    Sig: TAKE 1 TABLET BY MOUTH DAILY. TAKE WITH 200 MCG TABLET FOR TOTAL 250 MCG DAILY     Endocrinology:  Hypothyroid Agents Failed - 04/20/2019  8:17 AM      Failed - TSH needs to be rechecked within 3 months after an abnormal result. Refill until TSH is due.      Failed - Valid encounter within last 12 months    Recent Outpatient Visits          11 months ago Annual physical exam   [place] [facility], Lanelle Bal, DO   1 year ago Hypothyroidism, unspecified type   [facility], Lanelle Bal, DO   2 years ago Hypothyroidism, unspecified type   [facility], Lanelle Bal, DO   3 years ago Hypothyroidism, unspecified hypothyroidism type   [facility] Lanelle Bal, DO   3 years ago Anxiety associated with depression   [place] [facility] [place], [place]             Passed - TSH in normal range and within 360 days    TSH  Date Value Ref Range Status  11/26/2018 17.05 (H) mIU/L Final    Comment:              Reference Range .           > or = 20 Years  0.40-4.50 .                Pregnancy Ranges           First trimester    0.26-2.66           Second trimester   0.55-2.73           Third trimester    0.43-2.91

## 2019-07-10 ENCOUNTER — Other Ambulatory Visit: Payer: Self-pay | Admitting: Osteopathic Medicine

## 2019-07-10 DIAGNOSIS — F3342 Major depressive disorder, recurrent, in full remission: Secondary | ICD-10-CM

## 2019-07-10 DIAGNOSIS — H60543 Acute eczematoid otitis externa, bilateral: Secondary | ICD-10-CM

## 2019-07-10 DIAGNOSIS — E785 Hyperlipidemia, unspecified: Secondary | ICD-10-CM

## 2019-07-10 DIAGNOSIS — F418 Other specified anxiety disorders: Secondary | ICD-10-CM

## 2019-07-10 DIAGNOSIS — L408 Other psoriasis: Secondary | ICD-10-CM

## 2019-07-10 DIAGNOSIS — L309 Dermatitis, unspecified: Secondary | ICD-10-CM

## 2019-07-12 ENCOUNTER — Ambulatory Visit
Admission: RE | Admit: 2019-07-12 | Discharge: 2019-07-12 | Disposition: A | Discharge status: Home / Self Care | Payer: BLUE CROSS/BLUE SHIELD | Source: Ambulatory Visit | Attending: Osteopathic Medicine | Admitting: Osteopathic Medicine

## 2019-07-12 ENCOUNTER — Other Ambulatory Visit: Payer: Self-pay

## 2019-07-12 DIAGNOSIS — R920 Mammographic microcalcification found on diagnostic imaging of breast: Secondary | ICD-10-CM | POA: Diagnosis not present

## 2019-07-12 DIAGNOSIS — R921 Mammographic calcification found on diagnostic imaging of breast: Secondary | ICD-10-CM

## 2019-08-05 ENCOUNTER — Other Ambulatory Visit: Payer: Self-pay | Admitting: Osteopathic Medicine

## 2019-08-05 DIAGNOSIS — E785 Hyperlipidemia, unspecified: Secondary | ICD-10-CM

## 2019-08-05 DIAGNOSIS — F418 Other specified anxiety disorders: Secondary | ICD-10-CM

## 2019-08-05 DIAGNOSIS — F3342 Major depressive disorder, recurrent, in full remission: Secondary | ICD-10-CM

## 2019-08-18 ENCOUNTER — Other Ambulatory Visit: Payer: Self-pay | Admitting: Osteopathic Medicine

## 2019-08-18 DIAGNOSIS — H60543 Acute eczematoid otitis externa, bilateral: Secondary | ICD-10-CM

## 2019-08-19 ENCOUNTER — Other Ambulatory Visit: Payer: Self-pay | Admitting: Osteopathic Medicine

## 2019-08-19 DIAGNOSIS — F418 Other specified anxiety disorders: Secondary | ICD-10-CM

## 2019-08-19 DIAGNOSIS — F3342 Major depressive disorder, recurrent, in full remission: Secondary | ICD-10-CM

## 2019-08-19 DIAGNOSIS — E785 Hyperlipidemia, unspecified: Secondary | ICD-10-CM

## 2019-08-19 NOTE — Telephone Encounter (Signed)
   Last refill:  08/05/2019  Future visit scheduled: no  Notes to clinic:  REQUEST FOR 90 DAYS PRESCRIPTION   Requested Prescriptions  Pending Prescriptions Disp Refills   atorvastatin (LIPITOR) 20 MG tablet [Pharmacy Med Name: ATORVASTATIN 20 MG TABLET] 90 tablet 1    Sig: TAKE 1 TABLET BY MOUTH EVERY DAY *NEED OFFICE VISIT*      Cardiovascular:  Antilipid - Statins Failed - 08/19/2019  9:18 AM      Failed - Total Cholesterol in normal range and within 360 days    Cholesterol  Date Value Ref Range Status  03/12/2018 250 (H) <200 mg/dL Final          Failed - LDL in normal range and within 360 days    LDL Cholesterol (Calc)  Date Value Ref Range Status  03/12/2018 158 (H) mg/dL (calc) Final    Comment:    Reference range: <100 . Desirable range <100 mg/dL for primary prevention;   <70 mg/dL for patients with CHD or diabetic patients  with > or = 2 CHD risk factors. Marland Kitchen LDL-C is now calculated using the Martin-Hopkins  calculation, which is a validated novel method providing  better accuracy than the Friedewald equation in the  estimation of LDL-C.  Horald Pollen et al. Lenox Ahr. 4332;951(88): 2061-2068  (http://education.[employer].com/faq/FAQ164)           Failed - HDL in normal range and within 360 days    HDL  Date Value Ref Range Status  03/12/2018 51 >50 mg/dL Final          Failed - Triglycerides in normal range and within 360 days    Triglycerides  Date Value Ref Range Status  03/12/2018 241 (H) <150 mg/dL Final    Comment:    . If a non-fasting specimen was collected, consider repeat triglyceride testing on a fasting specimen if clinically indicated.  Perry Mount et al. J. of Clin. Lipidol. 2015;9:129-169. .           Failed - Valid encounter within last 12 months    Recent Outpatient Visits           1 year ago Annual physical exam   [place] [facility], Dorene Grebe, DO   1 year ago Hypothyroidism, unspecified  type   [facility], Dorene Grebe, DO   2 years ago Hypothyroidism, unspecified type   [facility], Dorene Grebe, DO   3 years ago Hypothyroidism, unspecified hypothyroidism type   [facility], Dorene Grebe, DO   3 years ago Anxiety associated with depression   [place] [facility], Dorene Grebe, DO              Passed - Patient is not pregnant

## 2019-08-19 NOTE — Telephone Encounter (Signed)
   Notes to clinic:  REQUEST FOR 90 DAYS PRESCRIPTION   Requested Prescriptions  Pending Prescriptions Disp Refills   citalopram (CELEXA) 40 MG tablet [Pharmacy Med Name: CITALOPRAM HBR 40 MG TABLET] 90 tablet 1    Sig: TAKE 1 TABLET BY MOUTH EVERY DAY *NEED DR APPT*      Psychiatry:  Antidepressants - SSRI Failed - 08/19/2019  9:18 AM      Failed - Completed PHQ-2 or PHQ-9 in the last 360 days.      Failed - Valid encounter within last 6 months    Recent Outpatient Visits           1 year ago Annual physical exam   [place] [facility], Dorene Grebe, DO   1 year ago Hypothyroidism, unspecified type   [facility], Dorene Grebe, DO   2 years ago Hypothyroidism, unspecified type   [facility], Dorene Grebe, DO   3 years ago Hypothyroidism, unspecified hypothyroidism type   [facility], Dorene Grebe, DO   3 years ago Anxiety associated with depression   [facility] [place], [place], DO

## 2019-08-29 ENCOUNTER — Other Ambulatory Visit: Payer: Self-pay | Admitting: Osteopathic Medicine

## 2019-08-29 DIAGNOSIS — E785 Hyperlipidemia, unspecified: Secondary | ICD-10-CM

## 2019-09-16 ENCOUNTER — Telehealth: Payer: Self-pay | Admitting: Osteopathic Medicine

## 2019-09-16 ENCOUNTER — Other Ambulatory Visit: Payer: Self-pay | Admitting: Osteopathic Medicine

## 2019-09-16 ENCOUNTER — Other Ambulatory Visit: Payer: Self-pay

## 2019-09-16 DIAGNOSIS — E785 Hyperlipidemia, unspecified: Secondary | ICD-10-CM

## 2019-09-16 DIAGNOSIS — F3342 Major depressive disorder, recurrent, in full remission: Secondary | ICD-10-CM

## 2019-09-16 DIAGNOSIS — F418 Other specified anxiety disorders: Secondary | ICD-10-CM

## 2019-09-16 MED ORDER — ATORVASTATIN CALCIUM 20 MG PO TABS: ORAL_TABLET | ORAL | 0 refills | Status: DC

## 2019-09-16 MED ORDER — CITALOPRAM HYDROBROMIDE 40 MG PO TABS: ORAL_TABLET | ORAL | 0 refills | Status: DC

## 2019-09-16 NOTE — Telephone Encounter (Signed)
Pt called this morning to schedule a med refill F/U. She is scheduled with [facility] on Tuesday morning. She is completely out of meds and is hoping that something can be called in to get her through to her appt. Pharmacy on file is correct.

## 2019-09-16 NOTE — Telephone Encounter (Signed)
Task completed. As per provider - #30 days refills for atovastatin and [facility]lexa [facility] No further refills. Pt must keep upcoming appt on 09/20/19. Pls let pt know, thanks.

## 2019-09-20 ENCOUNTER — Encounter: Payer: Self-pay | Admitting: Nurse Practitioner

## 2019-09-20 ENCOUNTER — Ambulatory Visit (INDEPENDENT_AMBULATORY_CARE_PROVIDER_SITE_OTHER): Payer: BLUE CROSS/BLUE SHIELD | Admitting: Nurse Practitioner

## 2019-09-20 ENCOUNTER — Other Ambulatory Visit: Payer: Self-pay

## 2019-09-20 VITALS — BP 143/93 | HR 79 | Temp 98.3°F | Ht 62.0 in | Wt 335.0 lb

## 2019-09-20 DIAGNOSIS — E785 Hyperlipidemia, unspecified: Secondary | ICD-10-CM

## 2019-09-20 DIAGNOSIS — E039 Hypothyroidism, unspecified: Secondary | ICD-10-CM

## 2019-09-20 DIAGNOSIS — F418 Other specified anxiety disorders: Secondary | ICD-10-CM

## 2019-09-20 DIAGNOSIS — F3342 Major depressive disorder, recurrent, in full remission: Secondary | ICD-10-CM

## 2019-09-20 DIAGNOSIS — Z6841 Body Mass Index (BMI) 40.0 and over, adult: Secondary | ICD-10-CM

## 2019-09-20 MED ORDER — ESCITALOPRAM OXALATE 20 MG PO TABS: ORAL_TABLET | ORAL | 1 refills | Status: DC

## 2019-09-20 MED ORDER — HYDROXYZINE HCL 25 MG PO TABS: 25.0000 mg | ORAL_TABLET | Freq: Every day | ORAL | 6 refills | Status: DC

## 2019-09-20 MED ORDER — ATORVASTATIN CALCIUM 20 MG PO TABS: ORAL_TABLET | ORAL | 1 refills | Status: DC

## 2019-09-20 NOTE — Progress Notes (Signed)
Established Patient Office Visit  Subjective:  Patient ID: Monique Austin, female    DOB: 01/24/1976  Age: 44 y.o. MRN: [id number]  CC:  Chief Complaint  Patient presents with  . Hyperlipidemia  . Anxiety    HPI Monique Austin presents for follow-up for depression, anxiety, hyperlipidemia, hypothyroid, and weight.  She would also like labs done today.   DEPRESSION/ANXIETY She has been feeling more anxious and depressed lately and having a lack of energy and motivation. She feels her symptoms are worse at home when she doesn't have things to keep her mind occupied. She also reports worry about her health. Mood status: exacerbated Satisfied with current treatment?: yes, but wonders if something else would be more helpful Symptom severity: moderate  Duration of current treatment : chronic Side effects: no Medication compliance: good compliance Psychotherapy/counseling: yes in the past Depressed mood: yes Anxious mood: yes Anhedonia: no Significant weight loss or gain: yes Insomnia: yes both Fatigue: yes Feelings of worthlessness or guilt: yes Impaired concentration/indecisiveness: yes Suicidal ideations: no Hopelessness: no Crying spells: yes Depression screen [facility] 2/9 09/20/2019 04/02/2016 05/31/2015  Decreased Interest 0 0 1  Down, Depressed, Hopeless 0 1 0  PHQ - 2 Score 0 1 1  Altered sleeping 1 3 3   Tired, decreased energy 1 3 3   Change in appetite 0 3 1  Feeling bad or failure about yourself  0 2 0  Trouble concentrating 0 0 0  Moving slowly or fidgety/restless 0 0 0  Suicidal thoughts 0 0 0  PHQ-9 Score 2 12 8   Difficult doing work/chores Somewhat difficult - Somewhat difficult   GAD 7 : Generalized Anxiety Score 09/20/2019 11/06/2016 04/02/2016 10/12/2015  Nervous, Anxious, on Edge 1 1 1 2   Control/stop worrying 1 1 3 2   Worry too much - different things 2 1 3 2   Trouble relaxing 1 1 2 2   Restless 1 1 0 2  Easily annoyed or irritable 0 1 2 2   Afraid -  awful might happen 0 1 2 2   Total GAD 7 Score 6 7 13 14   Anxiety Difficulty Somewhat difficult - Somewhat difficult Somewhat difficult   HYPERLIPIDEMIA Hyperlipidemia status: good compliance Satisfied with current treatment?  yes Side effects:  no Medication compliance: good compliance The 10-year ASCVD risk score 11/08/2016 DC Jr., et al., 2013) is: 1.4%   Values used to calculate the score:     Age: 37 years     Sex: Female     Is Non-Hispanic African American: No     Diabetic: No     Tobacco smoker: No     Systolic Blood Pressure: 143 mmHg     Is BP treated: No     HDL Cholesterol: 51 mg/dL     Total Cholesterol: 250 mg/dL Chest pain:  no Coronary artery disease:  no Family history CAD:  no Family history Monique Austin CAD:  no  HYPOTHYROIDISM Thyroid control status:controlled Satisfied with current treatment? yes Medication side effects: no Medication compliance: excellent compliance Recent dose adjustment:no Fatigue: yes Cold intolerance: no Heat intolerance: no Weight gain: yes Weight loss: no Constipation: no Diarrhea/loose stools: no Palpitations: no Lower extremity edema: no Anxiety/depressed mood: yes  WEIGHT GAIN She has put on some weight and is finding it very difficult to loose weight or to find the motivation to exercise. She feels a nutritionist may be helpful. She reports she enjoys a diet high in carbohydrates and that she is a very picky eater. She is not  sure where to begin with her weight loss measures. She was working out and losing weight prior to [place], but has not been able to go to the gym in a year now. She is very discouraged.    Past Medical History:  Diagnosis Date  . Allergy   . Anxiety   . Depression   . Thyroid disease     History reviewed. No pertinent surgical history.  Family History  Problem Relation Age of Onset  . Cancer Mother 66       postmenopausal breast   . Hyperlipidemia Father   . Hypertension Father     Social History    Socioeconomic History  . Marital status: Married    Spouse name: Not on file  . Number of children: Not on file  . Years of education: Not on file  . Highest education level: Not on file  Occupational History  . Not on file  Tobacco Use  . Smoking status: Never Smoker  . Smokeless tobacco: Never Used  Substance and Sexual Activity  . Alcohol use: No  . Drug use: No  . Sexual activity: Not on file  Other Topics Concern  . Not on file  Social History Narrative  . Not on file   Social Determinants of Health   Financial Resource Strain:   . Difficulty of Paying Living Expenses: Not on file  Food Insecurity:   . Worried About Charity fundraiser in the Last Year: Not on file  . Ran Out of Food in the Last Year: Not on file  Transportation Needs:   . Lack of Transportation (Medical): Not on file  . Lack of Transportation (Non-Medical): Not on file  Physical Activity:   . Days of Exercise per Week: Not on file  . Minutes of Exercise per Session: Not on file  Stress:   . Feeling of Stress : Not on file  Social Connections:   . Frequency of Communication with Friends and Family: Not on file  . Frequency of Social Gatherings with Friends and Family: Not on file  . Attends Religious Services: Not on file  . Active Member of Clubs or Organizations: Not on file  . Attends Archivist Meetings: Not on file  . Marital Status: Not on file  Intimate Partner Violence:   . Fear of Current or Ex-Partner: Not on file  . Emotionally Abused: Not on file  . Physically Abused: Not on file  . Sexually Abused: Not on file    Outpatient Medications Prior to Visit  Medication Sig Dispense Refill  . AMBULATORY NON FORMULARY MEDICATION Supply ordered: CPAP and other supplies needed (headgear, cushions, filters, heated tuubing and water chamber) Dx: obstructive sleep apnea Settings: auto-titration 4-20 cmH2O 1 Units prn  . citalopram (CELEXA) 40 MG tablet TAKE 1 TABLET BY MOUTH  EVERY DAY *NEED DR APPT* 90 tablet 1  . Clobetasol Prop Emollient Base 0.05 % emollient cream APPLY TO AFFECTED AREA TWICE A DAY X4 WEEKS THEN STOP X1 WEEK 60 g 0  . desonide (DESOWEN) 0.05 % cream APPLY TOPICALLY 2 (TWO) TIMES DAILY AS NEEDED. 60 g 0  . levothyroxine (SYNTHROID) 200 MCG tablet TAKE 1 TABLET BY MOUTH DAILY BEFORE BREAKFAST. TAKE WITH 50 MCG TABLET FOR TOTAL 250 MCG DAILY 90 tablet 1  . levothyroxine (SYNTHROID) 50 MCG tablet TAKE 1 TABLET BY MOUTH DAILY. TAKE WITH 200 MCG TABLET FOR TOTAL 250 MCG DAILY 90 tablet 1  . atorvastatin (LIPITOR) 20 MG tablet TAKE  1 TABLET BY MOUTH EVERY DAY *NEED OFFICE VISIT* 30 tablet 0  . busPIRone (BUSPAR) 10 MG tablet Take 0.5-1 tablets (5-10 mg total) by mouth 3 (three) times daily as needed (anxiety). (Patient not taking: Reported on 09/20/2019) 90 tablet 0  . phentermine (ADIPEX-P) 37.5 MG tablet Take 1 tablet (37.5 mg total) by mouth daily before breakfast. (Patient not taking: Reported on 09/20/2019) 30 tablet 0   No facility-administered medications prior to visit.    No Known Allergies  ROS Review of Systems  Constitutional: Positive for activity change, fatigue and unexpected weight change. Negative for chills and fever.  Respiratory: Negative for chest tightness and shortness of breath.   Cardiovascular: Negative for chest pain and palpitations.  Gastrointestinal: Negative for abdominal distention, abdominal pain, constipation, diarrhea, nausea and vomiting.  Endocrine: Negative for cold intolerance, heat intolerance, polydipsia, polyphagia and polyuria.  Genitourinary: Negative for difficulty urinating, dysuria and frequency.  Skin: Negative for color change, pallor and rash.  Neurological: Negative for dizziness, facial asymmetry, weakness and headaches.  Hematological: Does not bruise/bleed easily.  Psychiatric/Behavioral: Positive for decreased concentration and sleep disturbance. Negative for self-injury and suicidal ideas. The  patient is nervous/anxious. The patient is not hyperactive.      Objective:    Physical Exam  Constitutional: She is oriented to person, place, and time. She appears well-developed and well-nourished.  HENT:  Head: Normocephalic.  Eyes: Pupils are equal, round, and reactive to light. Conjunctivae and EOM are normal.  Neck: No thyromegaly present.  Cardiovascular: Normal rate and regular rhythm.  Pulmonary/Chest: Effort normal and breath sounds normal.  Abdominal: Soft. Bowel sounds are normal.  Musculoskeletal:        General: Normal range of motion.     Cervical back: Normal range of motion and neck supple.  Neurological: She is alert and oriented to person, place, and time. Coordination normal.  Skin: Skin is warm and dry.  Psychiatric: She has a normal mood and affect. Her behavior is normal. Thought content normal.  Nursing note and vitals reviewed.  BP (!) 143/93   Pulse 79   Temp 98.3 F (36.8 C) (Oral)   Ht 5\' 2"  (1.575 m)   Wt (!) 335 lb (152 kg)   LMP 09/12/2019   SpO2 95%   BMI 61.27 kg/m  Wt Readings from Last 3 Encounters:  09/20/19 (!) 335 lb (152 kg)  05/20/18 (!) 309 lb 8 oz (140.4 kg)  03/12/18 (!) 316 lb 8 oz (143.6 kg)     There are no preventive care reminders to display for this patient.  There are no preventive care reminders to display for this patient.  Lab Results  Component Value Date   TSH 17.05 (H) 11/26/2018   Lab Results  Component Value Date   WBC 10.5 03/12/2018   HGB 13.4 03/12/2018   HCT 40.9 03/12/2018   MCV 87.6 03/12/2018   PLT 339 03/12/2018   Lab Results  Component Value Date   NA 142 03/12/2018   K 4.3 03/12/2018   CO2 25 03/12/2018   GLUCOSE 94 03/12/2018   BUN 16 03/12/2018   CREATININE 0.70 03/12/2018   BILITOT 0.4 03/12/2018   ALKPHOS 74 07/03/2015   AST 15 03/12/2018   ALT 21 03/12/2018   PROT 6.9 03/12/2018   ALBUMIN 3.6 07/03/2015   CALCIUM 9.1 03/12/2018   Lab Results  Component Value Date   CHOL  250 (H) 03/12/2018   Lab Results  Component Value Date   HDL 51  03/12/2018   Lab Results  Component Value Date   LDLCALC 158 (H) 03/12/2018   Lab Results  Component Value Date   TRIG 241 (H) 03/12/2018   Lab Results  Component Value Date   CHOLHDL 4.9 03/12/2018   No results found for: HGBA1C    Assessment & Plan:   1. Recurrent major depressive disorder, in full remission (HCC) Exacerbation of chronic anxiety and depression. She is at the maximum dose of citalopram and has been on this medication for quite a while- she feels that it may not be working as well for her anymore. Discussed the option of adding a second medication or switching to a similar medication to see if it works better. Joint decision to trial escitalopram equivalent dose and hydroxyzine PRN for anxiety and sleep was made.  Will obtain TSH today to rue out thyroid etiology. Information provided on depression and anxiety. Discussed how to switch medications.  Will follow up in 4 weeks with video/telephone for depression and anxiety.  - COMPLETE METABOLIC PANEL WITH GFR - hydrOXYzine (ATARAX/VISTARIL) 25 MG tablet; Take 1 tablet (25 mg total) by mouth at bedtime. You may take a second dose in 20 minutes if the first dose is not effective.  Dispense: 60 tablet; Refill: 6 - escitalopram (LEXAPRO) 20 MG tablet; One tab (20 mg) by mouth daily.  Dispense: 90 tablet; Refill: 1  2. Hypothyroidism, unspecified type Will obtain labs today and follow-up with patient for any medications changes. She is taking her medication correctly and as prescribed. She has been well controlled on this dose for a while. Will follow-up with patient if changes are needed to medication management.  Follow-up in 6 months for blood work - TSH - COMPLETE METABOLIC PANEL WITH GFR - CBC with Differential - Amb ref to Medical Nutrition Therapy-MNT  3. Hyperlipidemia, unspecified hyperlipidemia type History of HLD with statin therapy. Will  obtain labs today to monitor this. Continue with atorvastatin 20mg  daily. Referral to nutrition for education and help with food choice. Follow up in 6 months  - Lipid panel - COMPLETE METABOLIC PANEL WITH GFR - CBC with Differential - atorvastatin (LIPITOR) 20 MG tablet; TAKE 1 TABLET BY MOUTH EVERY DAY  Dispense: 90 tablet; Refill: 1 - Amb ref to Medical Nutrition Therapy-MNT  4. Anxiety associated with depression Patient experiencing increased anxiety and depression related to the pandemic.  She is at the maximum dose of citalopram and has been on this medication for quite a while- she feels that it may not be working as well for her. Discussed the option of adding a second medication or switching to a similar medication to see if it works better. Joint decision to trial escitalopram equivalent dose and hydroxyzine PRN for anxiety and sleep was made.  Will obtain TSH today to rule out thyroid etiology  Information provided on depression and anxiety. Discussed how to switch medications.  Will follow up in 4 weeks with video/telephone for depression and anxiety.  - TSH - hydrOXYzine (ATARAX/VISTARIL) 25 MG tablet; Take 1 tablet (25 mg total) by mouth at bedtime. You may take a second dose in 20 minutes if the first dose is not effective.  Dispense: 60 tablet; Refill: 6 - escitalopram (LEXAPRO) 20 MG tablet; One tab (20 mg) by mouth daily.  Dispense: 90 tablet; Refill: 1  5. BMI 60.0-69.9, adult (HCC) Increased weight associated with lack of exercise and increased dietary intake during COVID-19. Referral to nutritionist provided to help with meal planning.  Discussed  development of exercise routine and the option of medications to help with this if she wishes. She will consider this and let me know. Follow-up in 6 months or sooner if needed.   Follow-up: Return in about 4 weeks (around 10/18/2019) for telephone/virtual anx/dep.    Tollie Eth, NP

## 2019-09-20 NOTE — Patient Instructions (Signed)
You may stop taking the citalopram today and start taking the escitalopram tomorrow.   Generalized Anxiety Disorder, Adult Generalized anxiety disorder (GAD) is a mental health disorder. People with this condition constantly worry about everyday events. Unlike normal anxiety, worry related to GAD is not triggered by a specific event. These worries also do not fade or get better with time. GAD interferes with life functions, including relationships, work, and school. GAD can vary from mild to severe. People with severe GAD can have intense waves of anxiety with physical symptoms (panic attacks). What are the causes? The exact cause of GAD is not known. What increases the risk? This condition is more likely to develop in:  Women.  People who have a family history of anxiety disorders.  People who are very shy.  People who experience very stressful life events, such as the death of a loved one.  People who have a very stressful family environment. What are the signs or symptoms? People with GAD often worry excessively about many things in their lives, such as their health and family. They may also be overly concerned about:  Doing well at work.  Being on time.  Natural disasters.  Friendships. Physical symptoms of GAD include:  Fatigue.  Muscle tension or having muscle twitches.  Trembling or feeling shaky.  Being easily startled.  Feeling like your heart is pounding or racing.  Feeling out of breath or like you cannot take a deep breath.  Having trouble falling asleep or staying asleep.  Sweating.  Nausea, diarrhea, or irritable bowel syndrome (IBS).  Headaches.  Trouble concentrating or remembering facts.  Restlessness.  Irritability. How is this diagnosed? Your health care provider can diagnose GAD based on your symptoms and medical history. You will also have a physical exam. The health care provider will ask specific questions about your symptoms, including  how severe they are, when they started, and if they come and go. Your health care provider may ask you about your use of alcohol or drugs, including prescription medicines. Your health care provider may refer you to a mental health specialist for further evaluation. Your health care provider will do a thorough examination and may perform additional tests to rule out other possible causes of your symptoms. To be diagnosed with GAD, a person must have anxiety that:  Is out of his or her control.  Affects several different aspects of his or her life, such as work and relationships.  Causes distress that makes him or her unable to take part in normal activities.  Includes at least three physical symptoms of GAD, such as restlessness, fatigue, trouble concentrating, irritability, muscle tension, or sleep problems. Before your health care provider can confirm a diagnosis of GAD, these symptoms must be present more days than they are not, and they must last for six months or longer. How is this treated? The following therapies are usually used to treat GAD:  Medicine. Antidepressant medicine is usually prescribed for long-term daily control. Antianxiety medicines may be added in severe cases, especially when panic attacks occur.  Talk therapy (psychotherapy). Certain types of talk therapy can be helpful in treating GAD by providing support, education, and guidance. Options include: ? Cognitive behavioral therapy (CBT). People learn coping skills and techniques to ease their anxiety. They learn to identify unrealistic or negative thoughts and behaviors and to replace them with positive ones. ? Acceptance and commitment therapy (ACT). This treatment teaches people how to be mindful as a way to cope with  unwanted thoughts and feelings. ? Biofeedback. This process trains you to manage your body's response (physiological response) through breathing techniques and relaxation methods. You will work with a  therapist while machines are used to monitor your physical symptoms.  Stress management techniques. These include yoga, meditation, and exercise. A mental health specialist can help determine which treatment is best for you. Some people see improvement with one type of therapy. However, other people require a combination of therapies. Follow these instructions at home:  Take over-the-counter and prescription medicines only as told by your health care provider.  Try to maintain a normal routine.  Try to anticipate stressful situations and allow extra time to manage them.  Practice any stress management or self-calming techniques as taught by your health care provider.  Do not punish yourself for setbacks or for not making progress.  Try to recognize your accomplishments, even if they are small.  Keep all follow-up visits as told by your health care provider. This is important. Contact a health care provider if:  Your symptoms do not get better.  Your symptoms get worse.  You have signs of depression, such as: ? A persistently sad, cranky, or irritable mood. ? Loss of enjoyment in activities that used to bring you joy. ? Change in weight or eating. ? Changes in sleeping habits. ? Avoiding friends or family members. ? Loss of energy for normal tasks. ? Feelings of guilt or worthlessness. Get help right away if:  You have serious thoughts about hurting yourself or others. If you ever feel like you may hurt yourself or others, or have thoughts about taking your own life, get help right away. You can go to your nearest emergency department or call:  Your local emergency services (911 in the [place]  A suicide crisis helpline, such as the National Suicide Prevention Lifeline at (207)014-1306. This is open 24 hours a day. Summary  Generalized anxiety disorder (GAD) is a mental health disorder that involves worry that is not triggered by a specific event.  People with GAD often  worry excessively about many things in their lives, such as their health and family.  GAD may cause physical symptoms such as restlessness, trouble concentrating, sleep problems, frequent sweating, nausea, diarrhea, headaches, and trembling or muscle twitching.  A mental health specialist can help determine which treatment is best for you. Some people see improvement with one type of therapy. However, other people require a combination of therapies. This information is not intended to replace advice given to you by your health care provider. Make sure you discuss any questions you have with your health care provider. Document Revised: 06/19/2017 Document Reviewed: 05/27/2016 Elsevier Patient Education  2020 [employer] Major Depressive Disorder, Adult Major depressive disorder (MDD) is a mental health condition. MDD often makes you feel sad, hopeless, or helpless. MDD can also cause symptoms in your body. MDD can affect your:  Work.  School.  Relationships.  Other normal activities. MDD can range from mild to very bad. It may occur once (single episode MDD). It can also occur many times (recurrent MDD). The main symptoms of MDD often include:  Feeling sad, depressed, or irritable most of the time.  Loss of interest. MDD symptoms also include:  Sleeping too much or too little.  Eating too much or too little.  A change in your weight.  Feeling tired (fatigue) or having low energy.  Feeling worthless.  Feeling guilty.  Trouble making decisions.  Trouble thinking clearly.  Thoughts of  suicide or harming others.  Feeling weak.  Feeling agitated.  Keeping yourself from being around other people (isolation). Follow these instructions at home: Activity  Do these things as told by your doctor: ? Go back to your normal activities. ? Exercise regularly. ? Spend time outdoors. Alcohol  Talk with your doctor about how alcohol can affect your antidepressant  medicines.  Do not drink alcohol. Or, limit how much alcohol you drink. ? This means no more than 1 drink a day for nonpregnant women and 2 drinks a day for men. One drink equals one of these:  12 oz of beer.  5 oz of wine.  1 oz of hard liquor. General instructions  Take over-the-counter and prescription medicines only as told by your doctor.  Eat a healthy diet.  Get plenty of sleep.  Find activities that you enjoy. Make time to do them.  Think about joining a support group. Your doctor may be able to suggest a group for you.  Keep all follow-up visits as told by your doctor. This is important. Where to find more information:  [employer] on Mental Illness: ? www.nami.org  [place] [employer] of Mental Health: ? http://www.maynard.net/  National Suicide Prevention Lifeline: ? 850-398-6995. This is free, 24-hour help. Contact a doctor if:  Your symptoms get worse.  You have new symptoms. Get help right away if:  You self-harm.  You see, hear, taste, smell, or feel things that are not present (hallucinate). If you ever feel like you may hurt yourself or others, or have thoughts about taking your own life, get help right away. You can go to your nearest emergency department or call:  Your local emergency services (911 in the [place]  A suicide crisis helpline, such as the National Suicide Prevention Lifeline: ? (641)883-2867. This is open 24 hours a day. This information is not intended to replace advice given to you by your health care provider. Make sure you discuss any questions you have with your health care provider. Document Revised: 06/19/2017 Document Reviewed: 03/23/2016 Elsevier Patient Education  2020 [employer].

## 2019-09-22 ENCOUNTER — Other Ambulatory Visit: Payer: Self-pay | Admitting: Nurse Practitioner

## 2019-09-22 DIAGNOSIS — E039 Hypothyroidism, unspecified: Secondary | ICD-10-CM | POA: Diagnosis not present

## 2019-09-22 DIAGNOSIS — E785 Hyperlipidemia, unspecified: Secondary | ICD-10-CM | POA: Diagnosis not present

## 2019-09-22 DIAGNOSIS — E063 Autoimmune thyroiditis: Secondary | ICD-10-CM

## 2019-09-22 DIAGNOSIS — E669 Obesity, unspecified: Secondary | ICD-10-CM | POA: Diagnosis not present

## 2019-09-22 LAB — COMPLETE METABOLIC PANEL WITH GFR
AG Ratio: 1.4 (calc) (ref 1.0–2.5)
ALT: 24 U/L (ref 6–29)
AST: 19 U/L (ref 10–30)
Albumin: 3.8 g/dL (ref 3.6–5.1)
Alkaline phosphatase (APISO): 90 U/L (ref 31–125)
BUN: 17 mg/dL (ref 7–25)
CO2: 26 mmol/L (ref 20–32)
Calcium: 9.3 mg/dL (ref 8.6–10.2)
Chloride: 105 mmol/L (ref 98–110)
Creat: 0.7 mg/dL (ref 0.50–1.10)
GFR, Est African American: 124 mL/min/{1.73_m2} (ref >=60)
GFR, Est Non African American: 107 mL/min/{1.73_m2} (ref >=60)
Globulin: 2.7 g/dL (calc) (ref 1.9–3.7)
Glucose, Bld: 126 mg/dL — ABNORMAL HIGH (ref 65–99)
Potassium: 4.1 mmol/L (ref 3.5–5.3)
Sodium: 141 mmol/L (ref 135–146)
Total Bilirubin: 0.4 mg/dL (ref 0.2–1.2)
Total Protein: 6.5 g/dL (ref 6.1–8.1)

## 2019-09-22 LAB — LIPID PANEL
Cholesterol: 187 mg/dL (ref <200)
HDL: 46 mg/dL — ABNORMAL LOW (ref >=50)
LDL Cholesterol (Calc): 103 mg/dL (calc) — ABNORMAL HIGH
Non-HDL Cholesterol (Calc): 141 mg/dL (calc) — ABNORMAL HIGH (ref <130)
Total CHOL/HDL Ratio: 4.1 (calc) (ref <5.0)
Triglycerides: 263 mg/dL — ABNORMAL HIGH (ref <150)

## 2019-09-22 LAB — CBC WITH DIFFERENTIAL/PLATELET
Absolute Monocytes: 522 cells/uL (ref 200–950)
Basophils Absolute: 63 cells/uL (ref 0–200)
Basophils Relative: 0.7 %
Eosinophils Absolute: 297 cells/uL (ref 15–500)
Eosinophils Relative: 3.3 %
HCT: 38.8 % (ref 35.0–45.0)
Hemoglobin: 13 g/dL (ref 11.7–15.5)
Lymphs Abs: 2115 cells/uL (ref 850–3900)
MCH: 28.1 pg (ref 27.0–33.0)
MCHC: 33.5 g/dL (ref 32.0–36.0)
MCV: 84 fL (ref 80.0–100.0)
MPV: 9.5 fL (ref 7.5–12.5)
Monocytes Relative: 5.8 %
Neutro Abs: 6003 cells/uL (ref 1500–7800)
Neutrophils Relative %: 66.7 %
Platelets: 346 10*3/uL (ref 140–400)
RBC: 4.62 10*6/uL (ref 3.80–5.10)
RDW: 13.8 % (ref 11.0–15.0)
Total Lymphocyte: 23.5 %
WBC: 9 10*3/uL (ref 3.8–10.8)

## 2019-09-22 LAB — T4, FREE: Free T4: 1 ng/dL (ref 0.8–1.8)

## 2019-09-22 LAB — TSH: TSH: 31.07 mIU/L — ABNORMAL HIGH

## 2019-09-22 LAB — HEMOGLOBIN A1C W/OUT EAG: Hgb A1c MFr Bld: 5.6 % of total Hgb (ref <5.7)

## 2019-10-18 ENCOUNTER — Encounter: Payer: Self-pay | Admitting: Osteopathic Medicine

## 2019-10-18 ENCOUNTER — Telehealth (INDEPENDENT_AMBULATORY_CARE_PROVIDER_SITE_OTHER): Payer: BLUE CROSS/BLUE SHIELD | Admitting: Osteopathic Medicine

## 2019-10-18 DIAGNOSIS — G4700 Insomnia, unspecified: Secondary | ICD-10-CM

## 2019-10-18 DIAGNOSIS — F418 Other specified anxiety disorders: Secondary | ICD-10-CM

## 2019-10-18 MED ORDER — TRAZODONE HCL 50 MG PO TABS: 25.0000 mg | ORAL_TABLET | Freq: Every evening | ORAL | 0 refills | Status: DC | PRN

## 2019-10-18 NOTE — Progress Notes (Signed)
Virtual Visit via Video (App used: MyChart) Note  I connected with      Monique Austin on 10/18/19 at 3:56 PM  by a telemedicine application and verified that I am speaking with the correct person using two identifiers.  Patient is at work I am in office   I discussed the limitations of evaluation and management by telemedicine and the availability of in person appointments. The patient expressed understanding and agreed to proceed.  History of Present Illness: Monique Austin is a 44 y.o. female who would like to discuss mental health    Hydroxyzine made her feel really weird, next day felt "off"  Tried this twice and same effect  Lexapro seems to be helping though still some anxiety  Sleep is biggest issue at this point    Observations/Objective: There were no vitals taken for this visit. BP Readings from Last 3 Encounters:  09/20/19 (!) 143/93  05/20/18 (!) 144/81  03/12/18 132/89   Exam: Normal Speech.    Lab and Radiology Results No results found for this or any previous visit (from the past 72 hour(s)). No results found.     Assessment and Plan: 44 y.o. female with The primary encounter diagnosis was Anxiety associated with depression. A diagnosis of Insomnia, unspecified type was also pertinent to this visit.  We will trial trazodone to help insomnia, TCA may also help augment SSRI, but would keep at low dose to avoid serotonin syndrome complications.  MyChart message was generated to remind patient to reach out to [place] in 2 weeks to see how she is doing.  If doing well, can refill medications, if not can set up virtual visit to discuss alternatives.  PDMP not reviewed this encounter. No orders of the defined types were placed in this encounter.  Meds ordered this encounter  Medications  . traZODone (DESYREL) 50 MG tablet    Sig: Take 0.5-2 tablets (25-100 mg total) by mouth at bedtime as needed for sleep.    Dispense:  30 tablet    Refill:   0     Follow Up Instructions: pending MyChart message in 2 weeks    I discussed the assessment and treatment plan with the patient. The patient was provided an opportunity to ask questions and all were answered. The patient agreed with the plan and demonstrated an understanding of the instructions.   The patient was advised to call back or seek an in-person evaluation if any new concerns, if symptoms worsen or if the condition fails to improve as anticipated.  30 minutes of non-face-to-face time was provided during this encounter.      . . . . . . . . . . . . . Marland Kitchen                   Historical information moved to improve visibility of documentation.  Past Medical History:  Diagnosis Date  . Allergy   . Anxiety   . Depression   . Thyroid disease    No past surgical history on file. Social History   Tobacco Use  . Smoking status: Never Smoker  . Smokeless tobacco: Never Used  Substance Use Topics  . Alcohol use: No   family history includes Cancer (age of onset: 23) in her mother; Hyperlipidemia in her father; Hypertension in her father.  Medications: Current Outpatient Medications  Medication Sig Dispense Refill  . AMBULATORY NON FORMULARY MEDICATION Supply ordered: CPAP and other supplies needed (headgear, cushions, filters,  heated tuubing and water chamber) Dx: obstructive sleep apnea Settings: auto-titration 4-20 cmH2O 1 Units prn  . atorvastatin (LIPITOR) 20 MG tablet TAKE 1 TABLET BY MOUTH EVERY DAY 90 tablet 1  . Clobetasol Prop Emollient Base 0.05 % emollient cream APPLY TO AFFECTED AREA TWICE A DAY X4 WEEKS THEN STOP X1 WEEK 60 g 0  . desonide (DESOWEN) 0.05 % cream APPLY TOPICALLY 2 (TWO) TIMES DAILY AS NEEDED. 60 g 0  . escitalopram (LEXAPRO) 20 MG tablet One tab (20 mg) by mouth daily. 90 tablet 1  . levothyroxine (SYNTHROID) 200 MCG tablet TAKE 1 TABLET BY MOUTH DAILY BEFORE BREAKFAST. TAKE WITH 50 MCG TABLET FOR TOTAL 250 MCG  DAILY 90 tablet 1  . levothyroxine (SYNTHROID) 50 MCG tablet TAKE 1 TABLET BY MOUTH DAILY. TAKE WITH 200 MCG TABLET FOR TOTAL 250 MCG DAILY 90 tablet 1  . traZODone (DESYREL) 50 MG tablet Take 0.5-2 tablets (25-100 mg total) by mouth at bedtime as needed for sleep. 30 tablet 0   No current facility-administered medications for this visit.   No Known Allergies

## 2019-10-18 NOTE — Progress Notes (Signed)
Contacted pt at 140 pm, 330 pm and 340pm. No answer, left multiple vm msgs.

## 2019-10-19 ENCOUNTER — Encounter: Payer: Self-pay | Admitting: Osteopathic Medicine

## 2019-10-25 ENCOUNTER — Other Ambulatory Visit: Payer: Self-pay | Admitting: Osteopathic Medicine

## 2019-10-25 MED ORDER — TRAZODONE HCL 50 MG PO TABS: 50.0000 mg | ORAL_TABLET | Freq: Every evening | ORAL | 1 refills | Status: DC | PRN

## 2019-10-31 ENCOUNTER — Encounter (INDEPENDENT_AMBULATORY_CARE_PROVIDER_SITE_OTHER): Payer: BLUE CROSS/BLUE SHIELD | Admitting: Osteopathic Medicine

## 2019-10-31 DIAGNOSIS — R252 Cramp and spasm: Secondary | ICD-10-CM

## 2019-10-31 DIAGNOSIS — N9489 Other specified conditions associated with female genital organs and menstrual cycle: Secondary | ICD-10-CM

## 2019-10-31 NOTE — Telephone Encounter (Signed)
5 mins spent  

## 2019-11-02 ENCOUNTER — Encounter: Payer: Self-pay | Admitting: Nurse Practitioner

## 2019-11-02 MED ORDER — NORGESTIMATE-ETH ESTRADIOL 0.25-35 MG-MCG PO TABS: 1.0000 | ORAL_TABLET | Freq: Every day | ORAL | 3 refills | Status: DC

## 2019-11-02 NOTE — Telephone Encounter (Signed)
Routing to provider  

## 2019-11-02 NOTE — Addendum Note (Signed)
Addended by: Deirdre Pippins on: 11/02/2019 10:09 PM   Modules accepted: Orders

## 2019-11-14 ENCOUNTER — Other Ambulatory Visit: Payer: Self-pay | Admitting: Physician Assistant

## 2019-11-14 DIAGNOSIS — E039 Hypothyroidism, unspecified: Secondary | ICD-10-CM

## 2020-01-10 ENCOUNTER — Other Ambulatory Visit: Payer: Self-pay | Admitting: Osteopathic Medicine

## 2020-01-10 DIAGNOSIS — E039 Hypothyroidism, unspecified: Secondary | ICD-10-CM

## 2020-01-30 ENCOUNTER — Other Ambulatory Visit: Payer: Self-pay | Admitting: Osteopathic Medicine

## 2020-01-30 DIAGNOSIS — L309 Dermatitis, unspecified: Secondary | ICD-10-CM

## 2020-01-30 DIAGNOSIS — H60543 Acute eczematoid otitis externa, bilateral: Secondary | ICD-10-CM

## 2020-01-30 DIAGNOSIS — L408 Other psoriasis: Secondary | ICD-10-CM

## 2020-02-06 ENCOUNTER — Other Ambulatory Visit: Payer: Self-pay | Admitting: Osteopathic Medicine

## 2020-02-06 DIAGNOSIS — E039 Hypothyroidism, unspecified: Secondary | ICD-10-CM

## 2020-03-02 ENCOUNTER — Other Ambulatory Visit: Payer: Self-pay

## 2020-03-02 DIAGNOSIS — E039 Hypothyroidism, unspecified: Secondary | ICD-10-CM

## 2020-03-02 DIAGNOSIS — E063 Autoimmune thyroiditis: Secondary | ICD-10-CM | POA: Diagnosis not present

## 2020-03-03 LAB — T4, FREE: Free T4: 1.2 ng/dL (ref 0.8–1.8)

## 2020-03-03 LAB — TSH+FREE T4: TSH W/REFLEX TO FT4: 13.1 mIU/L — ABNORMAL HIGH

## 2020-03-06 ENCOUNTER — Other Ambulatory Visit: Payer: Self-pay | Admitting: Osteopathic Medicine

## 2020-03-06 DIAGNOSIS — E039 Hypothyroidism, unspecified: Secondary | ICD-10-CM

## 2020-03-08 ENCOUNTER — Other Ambulatory Visit: Payer: Self-pay

## 2020-03-12 ENCOUNTER — Emergency Department: Admit: 2020-03-12 | Discharge status: Absent without leave | Payer: Self-pay

## 2020-03-12 ENCOUNTER — Emergency Department: Admit: 2020-03-12 | Discharge status: Absent without leave | Payer: BLUE CROSS/BLUE SHIELD

## 2020-03-12 DIAGNOSIS — Z20828 Contact with and (suspected) exposure to other viral communicable diseases: Secondary | ICD-10-CM | POA: Diagnosis not present

## 2020-03-13 ENCOUNTER — Other Ambulatory Visit: Payer: Self-pay

## 2020-03-13 ENCOUNTER — Emergency Department
Admission: RE | Admit: 2020-03-13 | Discharge: 2020-03-13 | Disposition: A | Discharge status: Home / Self Care | Payer: BLUE CROSS/BLUE SHIELD | Source: Ambulatory Visit

## 2020-03-13 VITALS — BP 157/107 | HR 89 | Temp 100.0°F | Resp 16

## 2020-03-13 DIAGNOSIS — B349 Viral infection, unspecified: Secondary | ICD-10-CM

## 2020-03-13 MED ORDER — AMOXICILLIN 500 MG PO CAPS: 500.0000 mg | ORAL_CAPSULE | Freq: Three times a day (TID) | ORAL | 0 refills | Status: DC

## 2020-03-13 NOTE — Discharge Instructions (Signed)
Follow-up with blood pressure Stop taking antihistamine with decongestant as this may cause elevation of blood pressure

## 2020-03-13 NOTE — ED Provider Notes (Signed)
[facility]    CSN: [id number] Arrival date & time: 03/13/20  1148      History   Chief Complaint Chief Complaint  Patient presents with  . Appointment    1200  . Headache  . Fatigue    HPI Monique Austin is a 44 y.o. female.   Chief complaints headache and fatigue.  There is some drainage but minimal.  She denies cough.  No known exposure to Covid.  Covid test is pending at the time of this visit.  She is out of work and was asked by her employer to see a doctor.  HPI  Past Medical History:  Diagnosis Date  . Allergy   . Anxiety   . Depression   . Thyroid disease     Patient Active Problem List   Diagnosis Date Noted  . Anxiety associated with depression 05/02/2015  . Eczema 05/02/2015  . Hyperlipidemia 11/08/2010  . CHRONIC LYMPHOCYTIC THYROIDITIS 11/12/2009  . PSORIASIS 11/12/2009  . Hypothyroidism 10/25/2009  . Major depressive disorder 10/25/2009    History reviewed. No pertinent surgical history.  OB History   No obstetric history on file.      Home Medications    Prior to Admission medications   Medication Sig Start Date End Date Taking? Authorizing Provider  AMBULATORY NON FORMULARY MEDICATION Supply ordered: CPAP and other supplies needed (headgear, cushions, filters, heated tuubing and water chamber) Dx: obstructive sleep apnea Settings: auto-titration 4-20 cmH2O 01/29/17   Sunnie Nielsen, DO  atorvastatin (LIPITOR) 20 MG tablet TAKE 1 TABLET BY MOUTH EVERY DAY 09/20/19   Early, Sung Amabile, NP  Clobetasol Prop Emollient Base 0.05 % emollient cream APPLY TO AFFECTED AREA TWICE A DAY X4 WEEKS THEN STOP X 1 WEEK.OVERDUE FOR FOLLOW-UP APPT 01/30/20   Sunnie Nielsen, DO  desonide (DESOWEN) 0.05 % cream APPLY TOPICALLY 2 (TWO) TIMES DAILY AS NEEDED. 08/18/19   Sunnie Nielsen, DO  escitalopram (LEXAPRO) 20 MG tablet One tab (20 mg) by mouth daily. 09/20/19   Tollie Eth, NP  levothyroxine (SYNTHROID) 200 MCG tablet TAKE 1 TABLET  BY MOUTH DAILY BEFORE BREAKFAST. TAKE WITH 50 MCG TABLET FOR TOTAL 250 MCG DAILY/ NEEDS LABS 03/08/20   Sunnie Nielsen, DO  levothyroxine (SYNTHROID) 50 MCG tablet TAKE 1 TABLET BY MOUTH DAILY. TAKE WITH 200 MCG TABLET FOR TOTAL 250 MCG DAILY/ NEEDS LABS 03/08/20   Sunnie Nielsen, DO  norgestimate-ethinyl estradiol (ORTHO-CYCLEN, 28,) 0.25-35 MG-MCG tablet Take 1 tablet by mouth daily. 11/02/19   Sunnie Nielsen, DO  traZODone (DESYREL) 50 MG tablet Take 1 tablet (50 mg total) by mouth at bedtime as needed for sleep. 10/25/19   Sunnie Nielsen, DO    Family History Family History  Problem Relation Age of Onset  . Cancer Mother 57       postmenopausal breast   . Hyperlipidemia Father   . Hypertension Father     Social History Social History   Tobacco Use  . Smoking status: Never Smoker  . Smokeless tobacco: Never Used  Substance Use Topics  . Alcohol use: No  . Drug use: No     Allergies   Patient has no known allergies.   Review of Systems Review of Systems  Constitutional: Positive for fever.  Respiratory: Positive for cough.   Neurological: Positive for headaches.  All other systems reviewed and are negative.    Physical Exam Triage Vital Signs ED Triage Vitals  Enc Vitals Group     BP 03/13/20 1201 Marland Kitchen)  157/107     Pulse Rate 03/13/20 1201 89     Resp 03/13/20 1201 16     Temp 03/13/20 1201 100 F (37.8 C)     Temp Source 03/13/20 1201 Oral     SpO2 03/13/20 1201 96 %     Weight --      Height --      Head Circumference --      Peak Flow --      Pain Score 03/13/20 1159 0     Pain Loc --      Pain Edu? --      Excl. in GC? --    No data found.  Updated Vital Signs BP (!) 157/107 (BP Location: Left Wrist)   Pulse 89   Temp 100 F (37.8 C) (Oral)   Resp 16   SpO2 96%   Visual Acuity Right Eye Distance:   Left Eye Distance:   Bilateral Distance:    Right Eye Near:   Left Eye Near:    Bilateral Near:     Physical Exam Vitals and  nursing note reviewed.  Constitutional:      Appearance: She is well-developed. She is obese.  HENT:     Head: Normocephalic.     Mouth/Throat:     Mouth: Mucous membranes are moist.  Cardiovascular:     Rate and Rhythm: Normal rate and regular rhythm.  Pulmonary:     Effort: Pulmonary effort is normal.     Breath sounds: Normal breath sounds.  Musculoskeletal:     Cervical back: Normal range of motion.  Neurological:     Mental Status: She is alert.  Psychiatric:        Mood and Affect: Mood normal.        Behavior: Behavior normal.      UC Treatments / Results  Labs (all labs ordered are listed, but only abnormal results are displayed) Labs Reviewed - No data to display  EKG   Radiology No results found.  Procedures Procedures (including critical care time)  Medications Ordered in UC Medications - No data to display  Initial Impression / Assessment and Plan / UC Course  I have reviewed the triage vital signs and the nursing notes.  Pertinent labs & imaging results that were available during my care of the patient were reviewed by me and considered in my medical decision making (see chart for details).     Viral syndrome.  Covid test pending.  Treat symptoms pending result of test Final Clinical Impressions(s) / UC Diagnoses   Final diagnoses:  None   Discharge Instructions   None    ED Prescriptions    None     PDMP not reviewed this encounter.   Frederica Kuster, MD 03/13/20 [phone]

## 2020-03-13 NOTE — ED Triage Notes (Signed)
Patient presents to Urgent Care with complaints of headache and fatigue since about 4 days ago. Patient reports she has not been vaccinated for covid, was tested yesterday and has not gotten the results back yet. Tried to make a PCP appointment and nobody answered the phone.  Pt has been taking zyrtec-d for her sx and some flonase.

## 2020-03-19 ENCOUNTER — Other Ambulatory Visit: Payer: Self-pay

## 2020-03-19 DIAGNOSIS — F418 Other specified anxiety disorders: Secondary | ICD-10-CM

## 2020-03-19 DIAGNOSIS — F3342 Major depressive disorder, recurrent, in full remission: Secondary | ICD-10-CM

## 2020-03-19 MED ORDER — ESCITALOPRAM OXALATE 20 MG PO TABS: ORAL_TABLET | ORAL | 1 refills | Status: DC

## 2020-03-19 NOTE — Telephone Encounter (Signed)
Last refill 10/08/19 Last Ov-  Vv 10/18/2019

## 2020-03-30 ENCOUNTER — Other Ambulatory Visit: Payer: Self-pay | Admitting: Osteopathic Medicine

## 2020-03-30 DIAGNOSIS — E039 Hypothyroidism, unspecified: Secondary | ICD-10-CM

## 2020-04-14 ENCOUNTER — Other Ambulatory Visit: Payer: Self-pay | Admitting: Osteopathic Medicine

## 2020-04-14 DIAGNOSIS — E039 Hypothyroidism, unspecified: Secondary | ICD-10-CM

## 2020-04-16 ENCOUNTER — Other Ambulatory Visit: Payer: Self-pay | Admitting: Nurse Practitioner

## 2020-04-16 DIAGNOSIS — E785 Hyperlipidemia, unspecified: Secondary | ICD-10-CM

## 2020-06-09 DIAGNOSIS — Z20822 Contact with and (suspected) exposure to covid-19: Secondary | ICD-10-CM | POA: Diagnosis not present

## 2020-06-12 ENCOUNTER — Telehealth (INDEPENDENT_AMBULATORY_CARE_PROVIDER_SITE_OTHER): Payer: BLUE CROSS/BLUE SHIELD | Admitting: Osteopathic Medicine

## 2020-06-12 ENCOUNTER — Encounter: Payer: Self-pay | Admitting: Osteopathic Medicine

## 2020-06-12 DIAGNOSIS — J019 Acute sinusitis, unspecified: Secondary | ICD-10-CM | POA: Diagnosis not present

## 2020-06-12 DIAGNOSIS — R238 Other skin changes: Secondary | ICD-10-CM | POA: Diagnosis not present

## 2020-06-12 MED ORDER — CLOBETASOL PROPIONATE 0.05 % EX FOAM: Freq: Two times a day (BID) | CUTANEOUS | 1 refills | Status: DC

## 2020-06-12 MED ORDER — GUAIFENESIN-CODEINE 100-10 MG/5ML PO SYRP
5.0000 mL | ORAL_SOLUTION | Freq: Four times a day (QID) | ORAL | 0 refills | Status: DC | PRN
Start: 2020-06-12 — End: 2020-08-15

## 2020-06-12 MED ORDER — AMOXICILLIN-POT CLAVULANATE 875-125 MG PO TABS: 1.0000 | ORAL_TABLET | Freq: Two times a day (BID) | ORAL | 0 refills | Status: AC

## 2020-06-12 MED ORDER — PREDNISONE 20 MG PO TABS: 20.0000 mg | ORAL_TABLET | Freq: Two times a day (BID) | ORAL | 0 refills | Status: DC

## 2020-06-12 MED ORDER — IPRATROPIUM BROMIDE 0.06 % NA SOLN: 2.0000 | Freq: Four times a day (QID) | NASAL | 1 refills | Status: DC

## 2020-06-12 NOTE — Progress Notes (Signed)
Virtual Visit via Video (App used: Telephone - difficulty w/ MyChart) Note  I connected with      Monique Austin on 06/12/20 at 1:47 PM  by a telemedicine application and verified that I am speaking with the correct person using two identifiers.  Patient is at home  I am in office   I discussed the limitations of evaluation and management by telemedicine and the availability of in person appointments. The patient expressed understanding and agreed to proceed.  History of Present Illness: Monique Austin is a 44 y.o. female who would like to discuss respiratory    Started a week or so ago Sore throat, post-nasal drip type symptoms Congestion now Coughing some  COVID PCR test this weekend came back negative  Typically gets like this in the fall when the weather changes  Noted some loose stool today   Also noting some scalp irritation, has tried several OTC remedies     Observations/Objective: There were no vitals taken for this visit. BP Readings from Last 3 Encounters:  03/13/20 (!) 157/107  09/20/19 (!) 143/93  05/20/18 (!) 144/81   Exam: Normal Speech.  NAD  Lab and Radiology Results No results found for this or any previous visit (from the past 72 hour(s)). No results found.     Assessment and Plan: 44 y.o. female with The primary encounter diagnosis was Acute non-recurrent sinusitis, unspecified location. A diagnosis of Scalp irritation was also pertinent to this visit.  --> negative COVID test, patient not vaccinated, we discussed recommendations for vaccine, etc --> treat as viral URI possible sinusitis --> clobetasol foam for scalp if T-Sal not helpful   PDMP not reviewed this encounter. No orders of the defined types were placed in this encounter.  Meds ordered this encounter  Medications  . predniSONE (DELTASONE) 20 MG tablet    Sig: Take 1 tablet (20 mg total) by mouth 2 (two) times daily with a meal.    Dispense:  10 tablet     Refill:  0  . amoxicillin-clavulanate (AUGMENTIN) 875-125 MG tablet    Sig: Take 1 tablet by mouth 2 (two) times daily for 10 days.    Dispense:  20 tablet    Refill:  0  . ipratropium (ATROVENT) 0.06 % nasal spray    Sig: Place 2 sprays into both nostrils 4 (four) times daily. As needed for runny nose / postnasal drip    Dispense:  15 mL    Refill:  1  . guaiFENesin-codeine (ROBITUSSIN AC) 100-10 MG/5ML syrup    Sig: Take 5-10 mLs by mouth 4 (four) times daily as needed for cough or congestion.    Dispense:  180 mL    Refill:  0   There are no Patient Instructions on file for this visit.     Follow Up Instructions: Return if symptoms worsen or fail to improve.    I discussed the assessment and treatment plan with the patient. The patient was provided an opportunity to ask questions and all were answered. The patient agreed with the plan and demonstrated an understanding of the instructions.   The patient was advised to call back or seek an in-person evaluation if any new concerns, if symptoms worsen or if the condition fails to improve as anticipated.  30 minutes of non-face-to-face time was provided during this encounter.      . . . . . . . . . . . . . Marland Kitchen  Historical information moved to improve visibility of documentation.  Past Medical History:  Diagnosis Date  . Allergy   . Anxiety   . Depression   . Thyroid disease    No past surgical history on file. Social History   Tobacco Use  . Smoking status: Never Smoker  . Smokeless tobacco: Never Used  Substance Use Topics  . Alcohol use: No   family history includes Cancer (age of onset: 72) in her mother; Hyperlipidemia in her father; Hypertension in her father.  Medications: Current Outpatient Medications  Medication Sig Dispense Refill  . atorvastatin (LIPITOR) 20 MG tablet TAKE 1 TABLET BY MOUTH EVERY DAY 90 tablet 1  . Clobetasol Prop Emollient Base 0.05 %  emollient cream APPLY TO AFFECTED AREA TWICE A DAY X4 WEEKS THEN STOP X 1 WEEK.OVERDUE FOR FOLLOW-UP APPT 60 g 0  . desonide (DESOWEN) 0.05 % cream APPLY TOPICALLY 2 (TWO) TIMES DAILY AS NEEDED. 60 g 0  . escitalopram (LEXAPRO) 20 MG tablet One tab (20 mg) by mouth daily. 90 tablet 1  . levothyroxine (SYNTHROID) 200 MCG tablet TAKE 1 TABLET BY MOUTH DAILY BEFORE BREAKFAST. TAKE WITH 50 MCG TABLET FOR TOTAL 250 MCG DAILY/ NEEDS LABS 90 tablet 1  . levothyroxine (SYNTHROID) 50 MCG tablet TAKE 1 TABLET BY MOUTH DAILY. TAKE WITH 200 MCG TABLET FOR TOTAL 250 MCG DAILY/ NEEDS LABS 90 tablet 1  . traZODone (DESYREL) 50 MG tablet Take 1 tablet (50 mg total) by mouth at bedtime as needed for sleep. 180 tablet 1  . AMBULATORY NON FORMULARY MEDICATION Supply ordered: CPAP and other supplies needed (headgear, cushions, filters, heated tuubing and water chamber) Dx: obstructive sleep apnea Settings: auto-titration 4-20 cmH2O (Patient not taking: Reported on 06/12/2020) 1 Units prn  . amoxicillin (AMOXIL) 500 MG capsule Take 1 capsule (500 mg total) by mouth 3 (three) times daily. (Patient not taking: Reported on 06/12/2020) 21 capsule 0  . norgestimate-ethinyl estradiol (ORTHO-CYCLEN, 28,) 0.25-35 MG-MCG tablet Take 1 tablet by mouth daily. (Patient not taking: Reported on 06/12/2020) 84 tablet 3   No current facility-administered medications for this visit.   No Known Allergies

## 2020-07-04 ENCOUNTER — Other Ambulatory Visit: Payer: Self-pay | Admitting: Osteopathic Medicine

## 2020-07-20 ENCOUNTER — Other Ambulatory Visit: Payer: Self-pay | Admitting: Osteopathic Medicine

## 2020-08-09 ENCOUNTER — Encounter: Payer: Self-pay | Admitting: Osteopathic Medicine

## 2020-08-15 ENCOUNTER — Telehealth (INDEPENDENT_AMBULATORY_CARE_PROVIDER_SITE_OTHER): Payer: BLUE CROSS/BLUE SHIELD | Admitting: Medical-Surgical

## 2020-08-15 ENCOUNTER — Encounter: Payer: Self-pay | Admitting: Medical-Surgical

## 2020-08-15 DIAGNOSIS — U071 COVID-19: Secondary | ICD-10-CM | POA: Diagnosis not present

## 2020-08-15 MED ORDER — ONDANSETRON 8 MG PO TBDP: 8.0000 mg | ORAL_TABLET | Freq: Three times a day (TID) | ORAL | 3 refills | Status: DC | PRN

## 2020-08-15 MED ORDER — DEXAMETHASONE 6 MG PO TABS: 6.0000 mg | ORAL_TABLET | Freq: Two times a day (BID) | ORAL | 0 refills | Status: DC

## 2020-08-15 MED ORDER — PROMETHAZINE-DM 6.25-15 MG/5ML PO SYRP: 5.0000 mL | ORAL_SOLUTION | Freq: Four times a day (QID) | ORAL | 0 refills | Status: DC | PRN

## 2020-08-15 NOTE — Progress Notes (Signed)
Virtual Visit via Video Note  I connected with Monique Austin on 08/15/20 at  2:40 PM EST by a video enabled telemedicine application and verified that I am speaking with the correct person using two identifiers.   I discussed the limitations of evaluation and management by telemedicine and the availability of in person appointments. The patient expressed understanding and agreed to proceed.  Patient location: home Provider locations: office  Subjective:    CC: COVID +  HPI: Pleasant 45 year old female presenting via MyChart video visit with reports of 10-11 days of COVID symptoms with a positive test 8 days ago. She continues to have nausea, diarrhea, BA, cough productive of clear mucus, sinus/chest congestion, chills, weakness/fatigue, and poor appetite. She has been taking Mucinex but switched to Sudafed. Taking Robitussin AC but has started to run out. Is waking a lot at night with sweats and coughing. Drinking well to stay hydrated but poor PO intake. Has tried to return to work but has trouble concentrating and completing her tasks. Denies fever, chest pain, and shortness of breath.   Past medical history, Surgical history, Family history not pertinant except as noted below, Social history, Allergies, and medications have been entered into the medical record, reviewed, and corrections made.   Review of Systems: See HPI for pertinent positives and negatives.   Objective:    General: Speaking clearly in complete sentences without any shortness of breath.  Alert and oriented x3.  Normal judgment. No apparent acute distress.  Impression and Recommendations:    1. COVID-19 virus infection Start Decadron 6mg  BID x 5 days. Sending in Zofran for nausea. Several cough medications backordered but will send in promethazine DM to see if this is available. Work note provided via MyChart to allow for the rest of the week out. Discussed the expected course of symptoms and the possibility  that she may have residual symptoms for several weeks.   I discussed the assessment and treatment plan with the patient. The patient was provided an opportunity to ask questions and all were answered. The patient agreed with the plan and demonstrated an understanding of the instructions.   The patient was advised to call back or seek an in-person evaluation if the symptoms worsen or if the condition fails to improve as anticipated.  20 minutes of non-face-to-face time was provided during this encounter.  Return if symptoms worsen or fail to improve.  , DNP, APRN, FNP-BC [facility]

## 2020-10-25 ENCOUNTER — Other Ambulatory Visit: Payer: Self-pay | Admitting: Osteopathic Medicine

## 2020-10-25 DIAGNOSIS — F3342 Major depressive disorder, recurrent, in full remission: Secondary | ICD-10-CM

## 2020-10-25 DIAGNOSIS — F418 Other specified anxiety disorders: Secondary | ICD-10-CM

## 2020-11-23 ENCOUNTER — Other Ambulatory Visit: Payer: Self-pay | Admitting: Osteopathic Medicine

## 2020-11-23 DIAGNOSIS — L408 Other psoriasis: Secondary | ICD-10-CM

## 2020-11-23 DIAGNOSIS — L309 Dermatitis, unspecified: Secondary | ICD-10-CM

## 2020-11-23 DIAGNOSIS — H60543 Acute eczematoid otitis externa, bilateral: Secondary | ICD-10-CM

## 2020-11-30 DIAGNOSIS — Z1231 Encounter for screening mammogram for malignant neoplasm of breast: Secondary | ICD-10-CM

## 2020-12-22 ENCOUNTER — Other Ambulatory Visit: Payer: Self-pay | Admitting: Nurse Practitioner

## 2020-12-22 DIAGNOSIS — E785 Hyperlipidemia, unspecified: Secondary | ICD-10-CM

## 2020-12-24 ENCOUNTER — Encounter: Payer: Self-pay | Admitting: Osteopathic Medicine

## 2020-12-24 DIAGNOSIS — E785 Hyperlipidemia, unspecified: Secondary | ICD-10-CM

## 2020-12-24 MED ORDER — ATORVASTATIN CALCIUM 20 MG PO TABS: 1.0000 | ORAL_TABLET | Freq: Every day | ORAL | 1 refills | Status: DC

## 2020-12-24 NOTE — Telephone Encounter (Signed)
Fine to refill #90 w/ 1 refill

## 2021-02-13 ENCOUNTER — Other Ambulatory Visit: Payer: Self-pay | Admitting: Osteopathic Medicine

## 2021-02-13 DIAGNOSIS — E039 Hypothyroidism, unspecified: Secondary | ICD-10-CM

## 2021-02-25 ENCOUNTER — Other Ambulatory Visit: Payer: Self-pay

## 2021-02-25 DIAGNOSIS — F418 Other specified anxiety disorders: Secondary | ICD-10-CM

## 2021-02-25 DIAGNOSIS — F3342 Major depressive disorder, recurrent, in full remission: Secondary | ICD-10-CM

## 2021-02-25 MED ORDER — ESCITALOPRAM OXALATE 20 MG PO TABS: ORAL_TABLET | ORAL | 0 refills | Status: DC

## 2021-03-06 ENCOUNTER — Other Ambulatory Visit: Payer: Self-pay

## 2021-03-06 DIAGNOSIS — E785 Hyperlipidemia, unspecified: Secondary | ICD-10-CM

## 2021-03-06 MED ORDER — ATORVASTATIN CALCIUM 20 MG PO TABS: 20.0000 mg | ORAL_TABLET | Freq: Every day | ORAL | 2 refills | Status: AC

## 2021-03-08 ENCOUNTER — Other Ambulatory Visit: Payer: Self-pay | Admitting: Osteopathic Medicine

## 2021-03-14 ENCOUNTER — Other Ambulatory Visit: Payer: Self-pay

## 2021-03-14 DIAGNOSIS — L408 Other psoriasis: Secondary | ICD-10-CM

## 2021-03-14 DIAGNOSIS — H60543 Acute eczematoid otitis externa, bilateral: Secondary | ICD-10-CM

## 2021-03-14 DIAGNOSIS — L309 Dermatitis, unspecified: Secondary | ICD-10-CM

## 2021-03-14 MED ORDER — CLOBETASOL PROP EMOLLIENT BASE 0.05 % EX CREA: TOPICAL_CREAM | CUTANEOUS | 0 refills | Status: DC

## 2021-03-15 ENCOUNTER — Other Ambulatory Visit: Payer: Self-pay | Admitting: *Deleted

## 2021-03-15 MED ORDER — CLOBETASOL PROPIONATE 0.05 % EX FOAM: Freq: Two times a day (BID) | CUTANEOUS | 1 refills | Status: AC

## 2021-03-21 ENCOUNTER — Encounter: Payer: Self-pay | Admitting: Family Medicine

## 2021-03-21 ENCOUNTER — Telehealth: Payer: BC Managed Care – PPO | Admitting: Family Medicine

## 2021-03-21 DIAGNOSIS — R0981 Nasal congestion: Secondary | ICD-10-CM | POA: Diagnosis not present

## 2021-03-21 DIAGNOSIS — R058 Other specified cough: Secondary | ICD-10-CM

## 2021-03-21 DIAGNOSIS — J3089 Other allergic rhinitis: Secondary | ICD-10-CM | POA: Diagnosis not present

## 2021-03-21 MED ORDER — PROMETHAZINE-DM 6.25-15 MG/5ML PO SYRP: 2.5000 mL | ORAL_SOLUTION | Freq: Four times a day (QID) | ORAL | 0 refills | Status: AC | PRN

## 2021-03-21 MED ORDER — FLUTICASONE PROPIONATE 50 MCG/ACT NA SUSP: 2.0000 | Freq: Every day | NASAL | 6 refills | Status: AC

## 2021-03-21 NOTE — Patient Instructions (Signed)
I appreciate the opportunity to provide you with care for your health and wellness.  I hope you feel better soon! Happy Early Birthday  Please continue to practice social distancing to keep you, your family, and our community safe.  If you must go out, please wear a mask and practice good handwashing.   Have a wonderful day. With Gratitude, Tereasa Coop, DNP, AGNP-BC   Allergies can cause a lot of symptoms: watery, itching eyes, runny nose (clear), sneezing, sinus pressure, and headaches. This is not making you contagious to others. You can not spread to others or catch this from others. These symptoms happen after you have been exposed to something that you are allergic to an allergen. Prevention: The best prevention is to avoid the things that you know you are allergic to, for example smoke (cigarette, cigar, wood); pollens and molds; animal dander; dust mites. And indoor inhalants such as cleaning products or aerosol sprays.  Target your bedroom as allergy free by removing carpets, damp mopping floors weekly, hanging washable curtains instead of blinds, removing books and stuffed animals, using foam pillows, and encasing pillows and mattress in plastic. Do not blow your nose too frequently or too hard. It may cause your eardrum to perforate (tear). Blow through both nostrils at the same time to equalize pressure.  Use tissue when you blow your nose. Dispose of them and then wash your hands. If no tissue is available, do the "elbow sneeze" into the bend of your arm (away from your open hands). Always wash your hands.  If able use the [facility] in the house and car to reduce exposure to pollens. Use an air filtration system in your house or buy a small one for your bedroom. Dust your house often, using a cloth and cleaner or polish that keeps the dust from flying into the air. Allergy testing can be done if you have had allergies for a long time and are not doing well on current treatments. Take your  medications as directed. If you find the medications are not working let your healthcare provider know. It might take more than one medication to control allergies, especially, seasonal ones.

## 2021-03-21 NOTE — Progress Notes (Signed)
Ms. baby, gieger are scheduled for a virtual visit with your provider today.    Just as we do with appointments in the office, we must obtain your consent to participate.  Your consent will be active for this visit and any virtual visit you may have with one of our providers in the next 365 days.    If you have a MyChart account, I can also send a copy of this consent to you electronically.  All virtual visits are billed to your insurance company just like a traditional visit in the office.  As this is a virtual visit, video technology does not allow for your provider to perform a traditional examination.  This may limit your provider's ability to fully assess your condition.  If your provider identifies any concerns that need to be evaluated in person or the need to arrange testing such as labs, EKG, etc, we will make arrangements to do so.    Although advances in technology are sophisticated, we cannot ensure that it will always work on either your end or our end.  If the connection with a video visit is poor, we may have to switch to a telephone visit.  With either a video or telephone visit, we are not always able to ensure that we have a secure connection.   I need to obtain your verbal consent now.   Are you willing to proceed with your visit today?   Monique Austin has provided verbal consent on 03/21/2021 for a virtual visit (video or telephone).   Freddy Finner, NP 03/21/2021  11:09 AM   Date:  03/21/2021   ID:  [name], DOB 11/25/1975, MRN [id number]  Patient Location: Home Provider Location: Home Office   Participants: Patient and Provider for Visit and Wrap up  Method of visit: Video  Location of Patient: Home Location of Provider: Home Office Consent was obtain for visit over the video. Services rendered by provider: Visit was performed via video  A video enabled telemedicine application was used and I verified that I am speaking with the correct person using  two identifiers.  PCP:  Sunnie Nielsen, DO   Chief Complaint:  congestion   History of Present Illness:    Monique Austin is a 45 y.o. female with history as stated below. Presents video telehealth for an acute care visit due to congestion COVID test negative. History of allergies and at times will develop sinus infections from them. Reports pressure in head ears, sore throat and ear pain.  Mild coughing due to drainage and congestion- mostly dry.  She reportsonset was two days ago.   Denies having fevers, chills, shortness of breath, chest pain, ear pain, sore throat or exposure to covid or other sick contacts. Modifying factors include: zyrtec-D and advil  No other aggravating or relieving factors.  No other c/o.  Past Medical, Surgical, Social History, Allergies, and Medications have been Reviewed.  Past Medical History:  Diagnosis Date   Allergy    Anxiety    Depression    Thyroid disease     No outpatient medications have been marked as taking for the 03/21/21 encounter (Appointment) with [facility] PROVIDER.     Allergies:   Patient has no known allergies.   ROS See HPI for history of present illness.  Physical Exam Constitutional:      Appearance: Normal appearance.  HENT:     Head: Normocephalic.  Eyes:     General: Allergic shiner present.  Conjunctiva/sclera: Conjunctivae normal.  Pulmonary:     Effort: Pulmonary effort is normal.     Comments: No cough or shortness of breath noted  Neurological:     Mental Status: She is alert.              A&P  1. Cough with congestion of paranasal sinus Allergy inducted congestion with cough Flonase and Prom DM provided and info on allergies in AVS OTC measures reviewed   Reviewed side effects, risks and benefits of medication.   Patient acknowledged agreement and understanding of the plan.    - fluticasone (FLONASE) 50 MCG/ACT nasal spray; Place 2 sprays into both nostrils daily.  Dispense:  16 g; Refill: 6 - promethazine-dextromethorphan (PROMETHAZINE-DM) 6.25-15 MG/5ML syrup; Take 2.5 mLs by mouth 4 (four) times daily as needed for cough.  Dispense: 118 mL; Refill: 0  2. Environmental and seasonal allergies   - fluticasone (FLONASE) 50 MCG/ACT nasal spray; Place 2 sprays into both nostrils daily.  Dispense: 16 g; Refill: 6 - promethazine-dextromethorphan (PROMETHAZINE-DM) 6.25-15 MG/5ML syrup; Take 2.5 mLs by mouth 4 (four) times daily as needed for cough.  Dispense: 118 mL; Refill: 0  I discussed the assessment and treatment plan with the patient. The patient was provided an opportunity to ask questions and all were answered. The patient agreed with the plan and demonstrated an understanding of the instructions.   The patient was advised to call back or seek an in-person evaluation if the symptoms worsen or if the condition fails to improve as anticipated.   The above assessment and management plan was discussed with the patient. The patient verbalized understanding of and has agreed to the management plan. Patient is aware to call the clinic if symptoms persist or worsen. Patient is aware when to return to the clinic for a follow-up visit. Patient educated on when it is appropriate to go to the emergency department.   Time:   Today, I have spent 10 minutes with the patient with telehealth technology discussing the above problems, reviewing the chart, previous notes, medications and orders.   Medication Changes: No orders of the defined types were placed in this encounter.    Disposition:  Follow up prn Signed, Freddy Finner, NP  03/21/2021 11:09 AM

## 2021-04-17 IMAGING — MG DIGITAL DIAGNOSTIC BILAT W/ TOMO W/ CAD
8 of 16 series · 8 of 40 positions shown · non-contrast
Comparison: Previous exam(s).

ACR Breast Density Category a: The breast tissue is almost entirely
fatty.

CLINICAL DATA: Patient presents for a bilateral diagnostic
is due for annual bilateral mammogram.

EXAM:
DIGITAL DIAGNOSTIC BILATERAL MAMMOGRAM WITH CAD AND TOMO

[R CC]
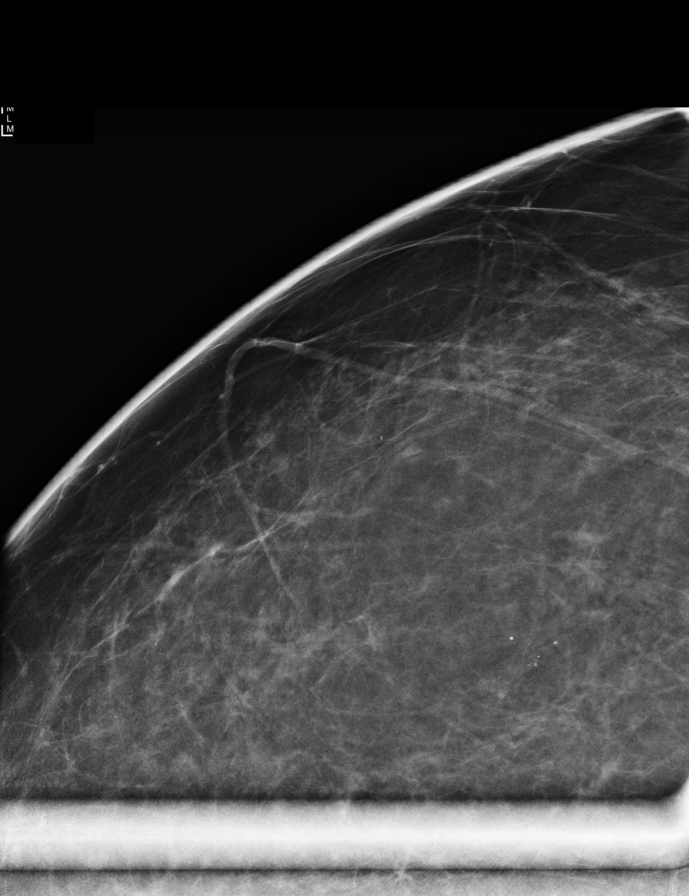

[R ML]
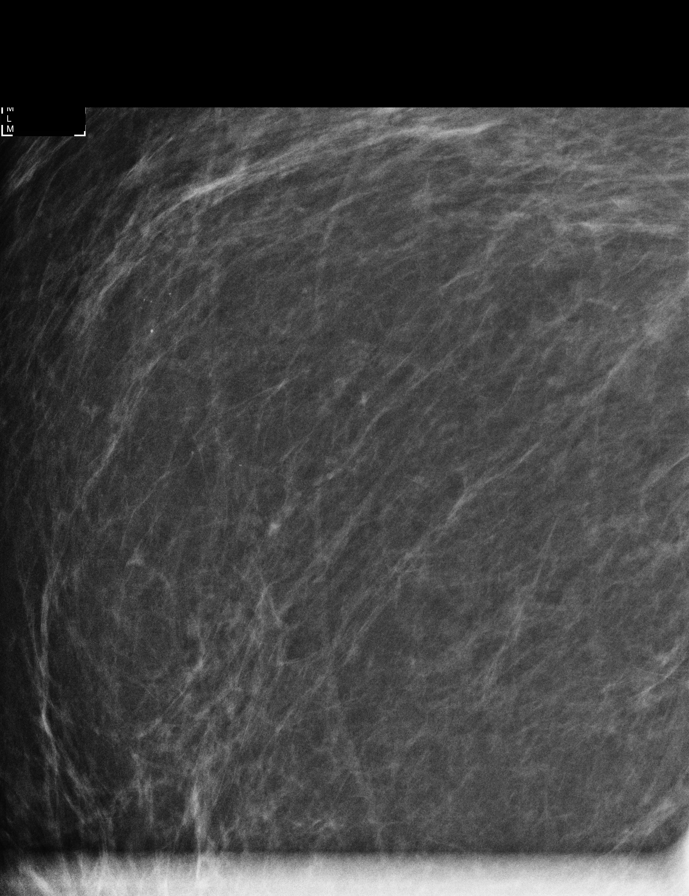

[L CV synth-2D]
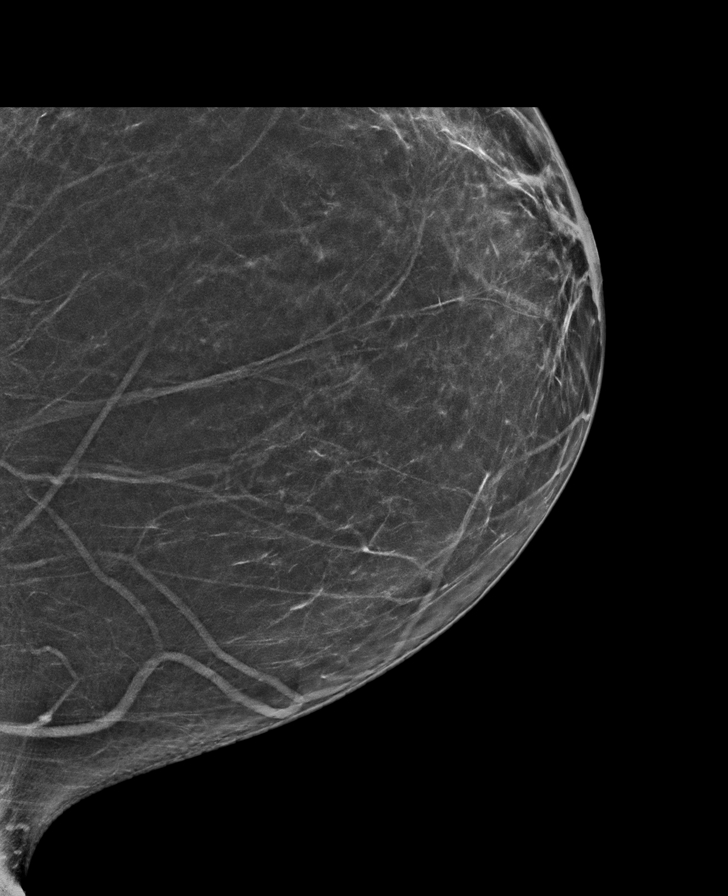

[L MLO synth-2D]
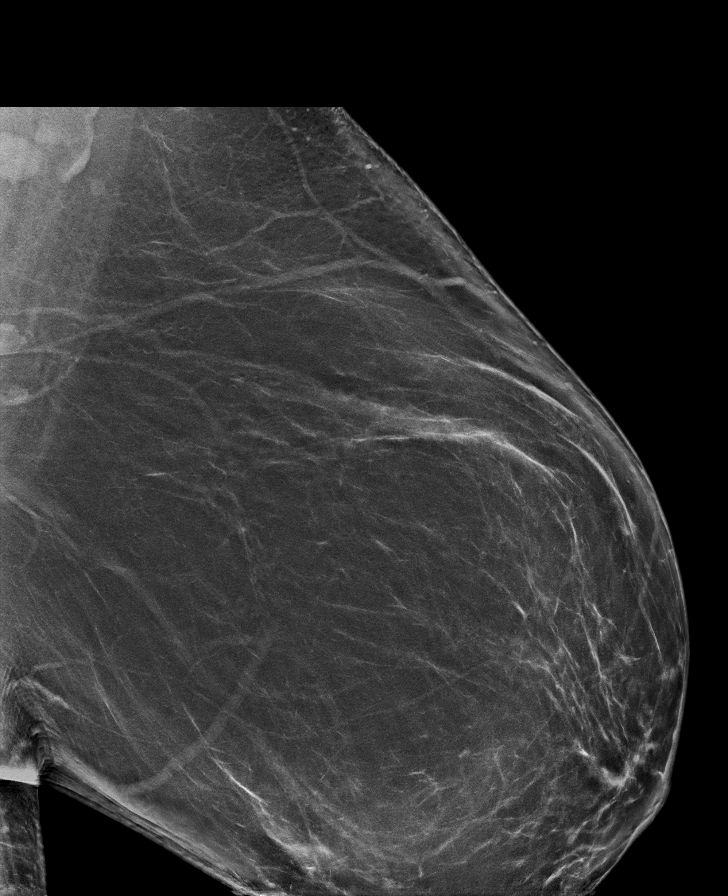

[R CV synth-2D]
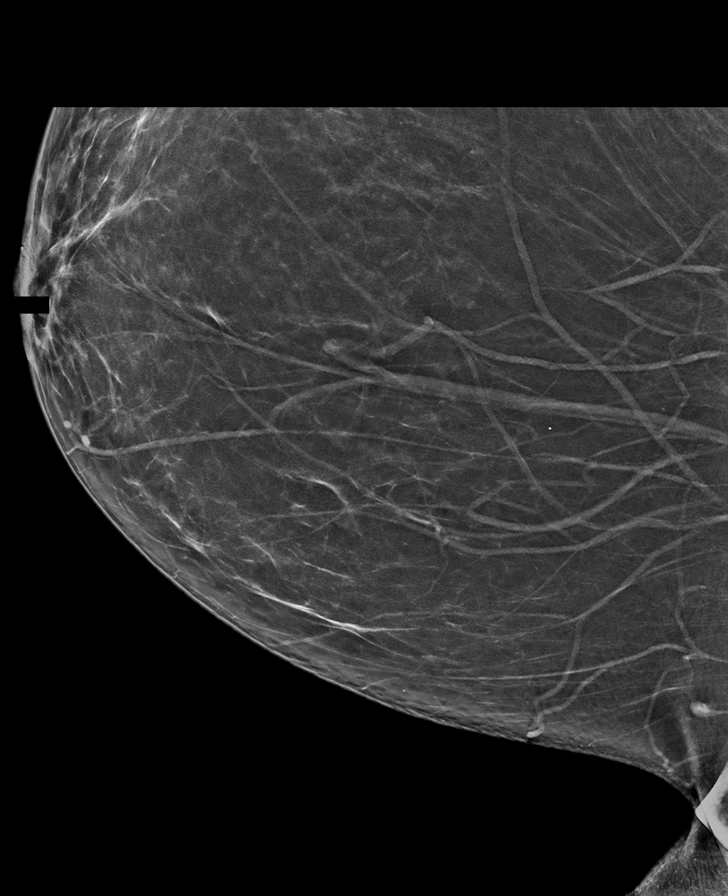

[R MLO synth-2D (1 of 2)]
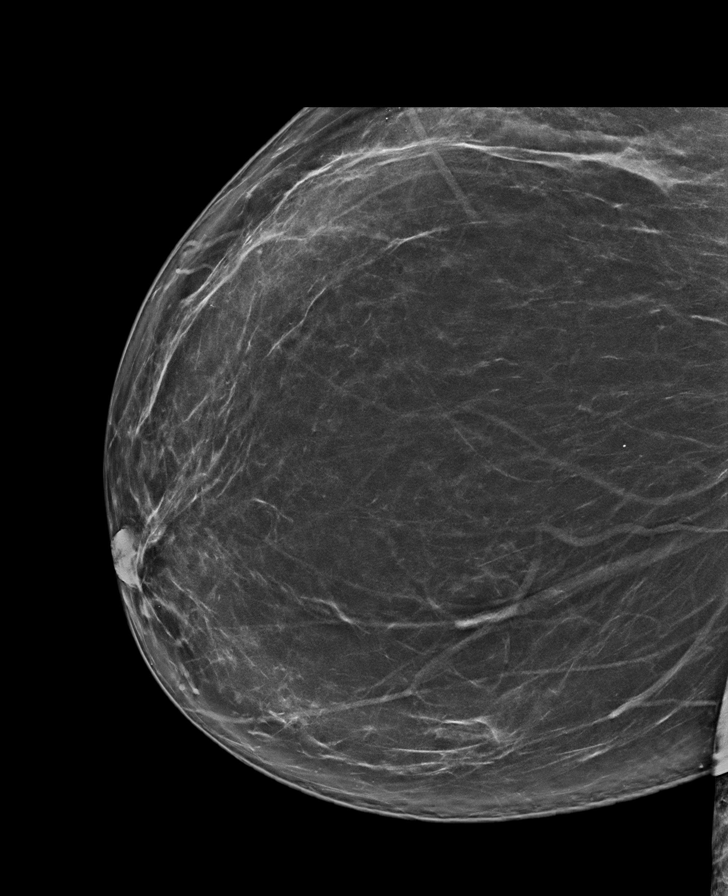

[R MLO synth-2D (2 of 2)]
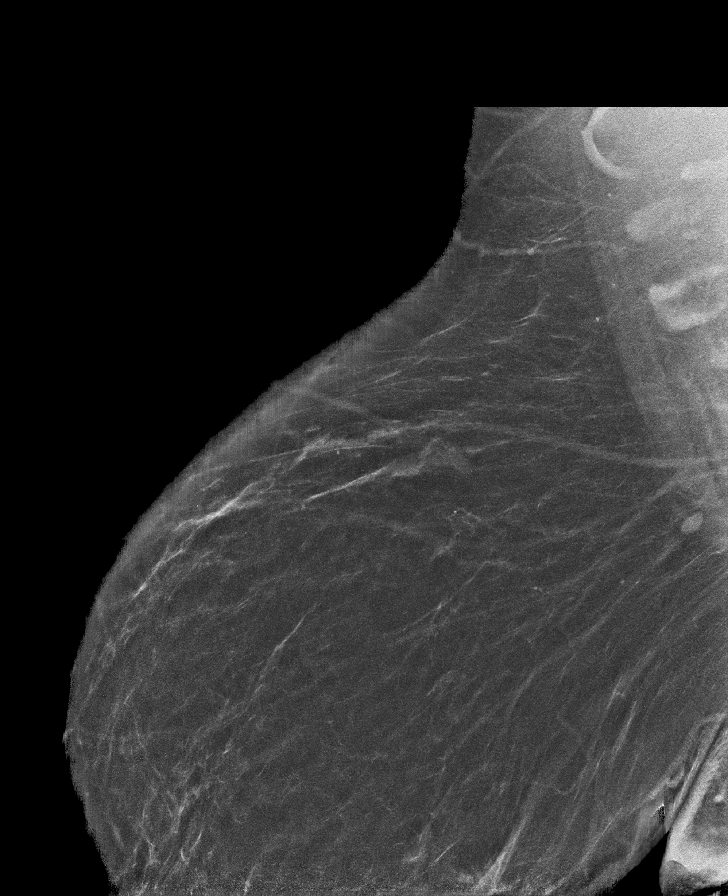

[R CC synth-2D]
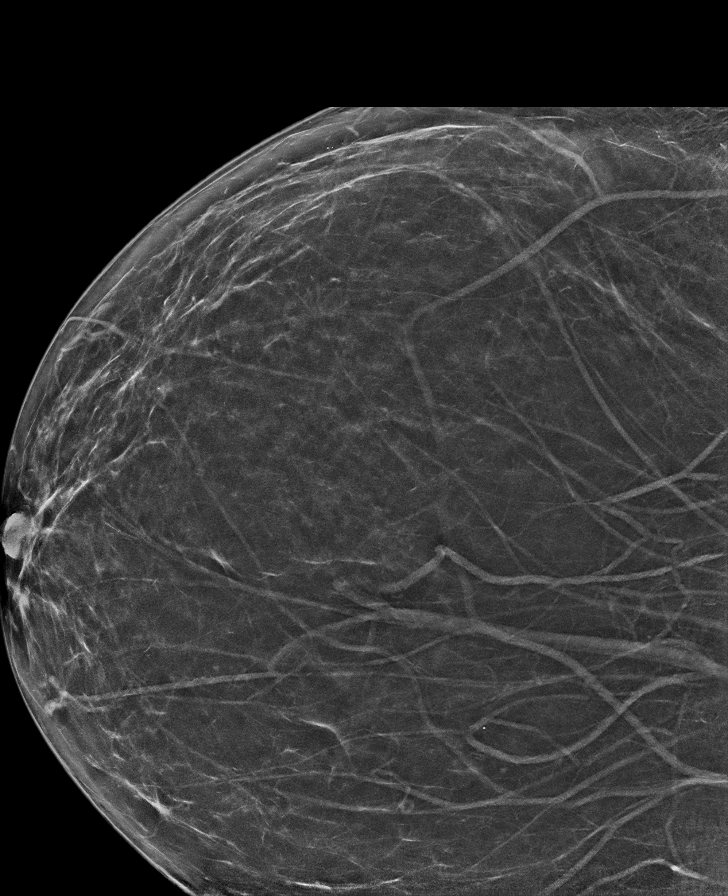

[8 of 40 positions shown; findings below may reference images not displayed]

FINDINGS: Examination demonstrates no change in a group of round
microcalcifications over the upper outer right breast. Remainder of
the right breast as well as the left breast is unchanged.

Mammographic images were processed with CAD.
IMPRESSION: Stable probable benign group of microcalcifications over the right
upper outer quadrant.

RECOMMENDATION:
Recommend an additional follow-up diagnostic right breast mammogram
with magnification views in 1 year at the time of patient's annual
bilateral mammogram to document 2 years of stability.

I have discussed the findings and recommendations with the patient.
If applicable, a reminder letter will be sent to the patient
regarding the next appointment.

BI-RADS CATEGORY  3: Probably benign.

## 2021-05-20 ENCOUNTER — Other Ambulatory Visit: Payer: Self-pay | Admitting: Osteopathic Medicine

## 2021-05-20 DIAGNOSIS — F418 Other specified anxiety disorders: Secondary | ICD-10-CM

## 2021-05-20 DIAGNOSIS — F3342 Major depressive disorder, recurrent, in full remission: Secondary | ICD-10-CM

## 2021-06-02 ENCOUNTER — Other Ambulatory Visit: Payer: Self-pay | Admitting: Osteopathic Medicine

## 2021-06-03 ENCOUNTER — Other Ambulatory Visit: Payer: Self-pay | Admitting: Family Medicine

## 2021-06-03 DIAGNOSIS — F3342 Major depressive disorder, recurrent, in full remission: Secondary | ICD-10-CM

## 2021-06-03 DIAGNOSIS — F418 Other specified anxiety disorders: Secondary | ICD-10-CM

## 2021-06-16 ENCOUNTER — Other Ambulatory Visit: Payer: Self-pay | Admitting: Osteopathic Medicine

## 2021-06-16 DIAGNOSIS — L408 Other psoriasis: Secondary | ICD-10-CM

## 2021-06-16 DIAGNOSIS — H60543 Acute eczematoid otitis externa, bilateral: Secondary | ICD-10-CM

## 2021-06-16 DIAGNOSIS — L309 Dermatitis, unspecified: Secondary | ICD-10-CM

## 2021-06-17 ENCOUNTER — Other Ambulatory Visit: Payer: Self-pay | Admitting: Physician Assistant

## 2021-07-24 ENCOUNTER — Other Ambulatory Visit: Payer: Self-pay | Admitting: Family Medicine

## 2021-07-24 DIAGNOSIS — F3342 Major depressive disorder, recurrent, in full remission: Secondary | ICD-10-CM

## 2021-07-24 DIAGNOSIS — F418 Other specified anxiety disorders: Secondary | ICD-10-CM

## 2021-07-29 ENCOUNTER — Other Ambulatory Visit: Payer: Self-pay

## 2021-07-29 ENCOUNTER — Ambulatory Visit: Payer: BC Managed Care – PPO | Admitting: Family Medicine

## 2021-07-29 ENCOUNTER — Encounter: Payer: Self-pay | Admitting: Family Medicine

## 2021-07-29 DIAGNOSIS — L408 Other psoriasis: Secondary | ICD-10-CM | POA: Diagnosis not present

## 2021-07-29 DIAGNOSIS — H60543 Acute eczematoid otitis externa, bilateral: Secondary | ICD-10-CM | POA: Diagnosis not present

## 2021-07-29 DIAGNOSIS — F419 Anxiety disorder, unspecified: Secondary | ICD-10-CM

## 2021-07-29 DIAGNOSIS — L309 Dermatitis, unspecified: Secondary | ICD-10-CM | POA: Diagnosis not present

## 2021-07-29 DIAGNOSIS — F32A Depression, unspecified: Secondary | ICD-10-CM

## 2021-07-29 DIAGNOSIS — E039 Hypothyroidism, unspecified: Secondary | ICD-10-CM

## 2021-07-29 DIAGNOSIS — Z6841 Body Mass Index (BMI) 40.0 and over, adult: Secondary | ICD-10-CM

## 2021-07-29 MED ORDER — LEVOTHYROXINE SODIUM 50 MCG PO TABS: ORAL_TABLET | ORAL | 0 refills | Status: AC

## 2021-07-29 MED ORDER — CLOBETASOL PROP EMOLLIENT BASE 0.05 % EX CREA: TOPICAL_CREAM | CUTANEOUS | 0 refills | Status: AC

## 2021-07-29 MED ORDER — DESONIDE 0.05 % EX OINT: 1.0000 "application " | TOPICAL_OINTMENT | Freq: Two times a day (BID) | CUTANEOUS | 0 refills | Status: AC

## 2021-07-29 MED ORDER — LEVOTHYROXINE SODIUM 200 MCG PO TABS: ORAL_TABLET | ORAL | 0 refills | Status: AC

## 2021-07-29 MED ORDER — HYDROXYZINE PAMOATE 25 MG PO CAPS: 25.0000 mg | ORAL_CAPSULE | Freq: Three times a day (TID) | ORAL | 0 refills | Status: AC | PRN

## 2021-07-29 MED ORDER — TRAZODONE HCL 50 MG PO TABS: ORAL_TABLET | ORAL | 0 refills | Status: AC

## 2021-07-29 NOTE — Progress Notes (Signed)
Established Patient Office Visit  Subjective:  Patient ID: Monique Austin, female    DOB: 17-May-1976  Age: 46 y.o. MRN: [id number]  CC:  Chief Complaint  Patient presents with   Medication Refill    HPI Monique BlareHeather Jean Austin presents for medication refills.   Anxiety/Depression: Ran out of anxiety meds 3 days ago, so very anxious today, previously working well for the most part, but stressful times lately. She does not do any counseling - states it hasn't worked in the past. Current regimen: Lexapro 20 mg and Trazodone 50 mg at night - (sometimes helpful, but other times her mind is wandering and she can't sleep; didn't like taking 100 mg - made her feel too drugged the next day) Denies suicidal/homicidal ideation  Would like to try something PRN for panic attacks    Eczema/Psoriasis (Elbows, knees, ears): Clobetasol topical working really well for elbows and knees - needs refill Desonide for ear eczema - prefers to try ointment instead of cream  No current complaints.    Hypothyroidism: Currently on 250 mcg daily Labs not checked in over a year Denies any fatigue, hair/skin/nail changes, weight changes, chest pain, dyspnea, palpitations     Past Medical History:  Diagnosis Date   Allergy    Anxiety    Depression    Thyroid disease     No past surgical history on file.  Family History  Problem Relation Age of Onset   Cancer Mother [id number]       postmenopausal breast    Hyperlipidemia Father    Hypertension Father     Social History   Socioeconomic History   Marital status: Married    Spouse name: Not on file   Number of children: Not on file   Years of education: Not on file   Highest education level: Not on file  Occupational History   Not on file  Tobacco Use   Smoking status: Never   Smokeless tobacco: Never  Substance and Sexual Activity   Alcohol use: No   Drug use: No   Sexual activity: Not on file  Other Topics Concern   Not on file   Social History Narrative   Not on file   Social Determinants of Health   Financial Resource Strain: Not on file  Food Insecurity: Not on file  Transportation Needs: Not on file  Physical Activity: Not on file  Stress: Not on file  Social Connections: Not on file  Intimate Partner Violence: Not on file    Outpatient Medications Prior to Visit  Medication Sig Dispense Refill   atorvastatin (LIPITOR) 20 MG tablet Take 1 tablet (20 mg total) by mouth daily. 90 tablet 2   clobetasol (OLUX) 0.05 % topical foam Apply topically 2 (two) times daily. 100 g 1   escitalopram (LEXAPRO) 20 MG tablet ONE TAB (20 MG) BY MOUTH DAILY. NO REFILLS. NEEDS TO TRANSITION CARE TO NEW PCP. 30 tablet 0   fluticasone (FLONASE) 50 MCG/ACT nasal spray Place 2 sprays into both nostrils daily. 16 g 6   promethazine-dextromethorphan (PROMETHAZINE-DM) 6.25-15 MG/5ML syrup Take 2.5 mLs by mouth 4 (four) times daily as needed for cough. 118 mL 0   Clobetasol Prop Emollient Base (CLOBETASOL PROPIONATE E) 0.05 % emollient cream APPLY TO AFFECTED AREA TWICE A DAY X4 WEEKS THEN STOP X 1 WEEK. LAST REFILL. NEEDS A VISIT. 15 g 0   desonide (DESOWEN) 0.05 % cream APPLY TOPICALLY 2 (TWO) TIMES DAILY AS NEEDED. 60 g 0  levothyroxine (SYNTHROID) 200 MCG tablet TAKE 1 TABLET BY MOUTH DAILY BEFORE BREAKFAST. TAKE WITH 50 MCG TABLET FOR TOTAL 250 MCG DAILY/ NEEDS LABS 90 tablet 0   levothyroxine (SYNTHROID) 50 MCG tablet TAKE 1 TABLET BY MOUTH DAILY. TAKE WITH 200 MCG TABLET FOR TOTAL 250 MCG DAILY/ NEEDS LABS 90 tablet 0   traZODone (DESYREL) 50 MG tablet Take 1/2 - 2 tablets by mouth at bedtime. NO REFILLS. NEEDS TO TRANSITION CARE. 90 tablet 0   dexamethasone (DECADRON) 6 MG tablet Take 1 tablet (6 mg total) by mouth 2 (two) times daily with a meal. 10 tablet 0   ipratropium (ATROVENT) 0.06 % nasal spray PLACE 2 SPRAYS INTO BOTH NOSTRILS 4 (FOUR) TIMES DAILY. AS NEEDED FOR RUNNY NOSE / POSTNASAL DRIP 15 mL 1   ondansetron  (ZOFRAN-ODT) 8 MG disintegrating tablet Take 1 tablet (8 mg total) by mouth every 8 (eight) hours as needed for nausea. 20 tablet 3   No facility-administered medications prior to visit.    No Known Allergies  ROS Review of Systems All review of systems negative except what is listed in the HPI    Objective:    Physical Exam Vitals reviewed.  Constitutional:      Appearance: Normal appearance. She is obese.  HENT:     Head: Normocephalic and atraumatic.  Cardiovascular:     Rate and Rhythm: Normal rate and regular rhythm.     Pulses: Normal pulses.     Heart sounds: Normal heart sounds.  Pulmonary:     Effort: Pulmonary effort is normal.     Breath sounds: Normal breath sounds.  Musculoskeletal:     Cervical back: Normal range of motion and neck supple. No tenderness.  Lymphadenopathy:     Cervical: No cervical adenopathy.  Skin:    General: Skin is warm and dry.  Neurological:     General: No focal deficit present.     Mental Status: She is alert and oriented to person, place, and time. Mental status is at baseline.  Psychiatric:        Mood and Affect: Mood normal.        Behavior: Behavior normal.        Thought Content: Thought content normal.        Judgment: Judgment normal.       BP (!) 162/94 (BP Location: Left Arm, Patient Position: Sitting, Cuff Size: Large)    Pulse 82    Temp 97.8 F (36.6 C) (Oral)    Ht 5\' 2"  (1.575 m)    Wt (!) 354 lb 0.6 oz (160.6 kg)    SpO2 99%    BMI 64.75 kg/m  Wt Readings from Last 3 Encounters:  07/29/21 (!) 354 lb 0.6 oz (160.6 kg)  09/20/19 (!) 335 lb (152 kg)  05/20/18 (!) 309 lb 8 oz (140.4 kg)     There are no preventive care reminders to display for this patient.  There are no preventive care reminders to display for this patient.  Lab Results  Component Value Date   TSH 31.07 (H) 09/20/2019   Lab Results  Component Value Date   WBC 9.0 09/20/2019   HGB 13.0 09/20/2019   HCT 38.8 09/20/2019   MCV 84.0  09/20/2019   PLT 346 09/20/2019   Lab Results  Component Value Date   NA 141 09/20/2019   K 4.1 09/20/2019   CO2 26 09/20/2019   GLUCOSE 126 (H) 09/20/2019   BUN 17 09/20/2019   CREATININE  0.70 09/20/2019   BILITOT 0.4 09/20/2019   ALKPHOS 74 07/03/2015   AST 19 09/20/2019   ALT 24 09/20/2019   PROT 6.5 09/20/2019   ALBUMIN 3.6 07/03/2015   CALCIUM 9.3 09/20/2019   Lab Results  Component Value Date   CHOL 187 09/20/2019   Lab Results  Component Value Date   HDL 46 (L) 09/20/2019   Lab Results  Component Value Date   LDLCALC 103 (H) 09/20/2019   Lab Results  Component Value Date   TRIG 263 (H) 09/20/2019   Lab Results  Component Value Date   CHOLHDL 4.1 09/20/2019   Lab Results  Component Value Date   HGBA1C 5.6 09/20/2019      Assessment & Plan:    1. Eczema of both external ears Refills placed.  - desonide (DESOWEN) 0.05 % ointment; Apply 1 application topically 2 (two) times daily.  Dispense: 15 g; Refill: 0  2. PSORIASIS 3. Eczema, unspecified type Refills placed.  - Clobetasol Prop Emollient Base (CLOBETASOL PROPIONATE E) 0.05 % emollient cream; APPLY TO AFFECTED AREA TWICE A DAY X4 WEEKS THEN STOP X 1 WEEK.  Dispense: 60 g; Refill: 0  4. Hypothyroidism, unspecified type Refills placed. Labs today. Will adjust if needed.  - levothyroxine (SYNTHROID) 200 MCG tablet; TAKE 1 TABLET BY MOUTH DAILY BEFORE BREAKFAST. TAKE WITH 50 MCG TABLET FOR TOTAL 250 MCG DAILY/ NEEDS LABS  Dispense: 90 tablet; Refill: 0 - levothyroxine (SYNTHROID) 50 MCG tablet; TAKE 1 TABLET BY MOUTH DAILY. TAKE WITH 200 MCG TABLET FOR TOTAL 250 MCG DAILY/ NEEDS LABS  Dispense: 90 tablet; Refill: 0 - TSH  5. Class 3 severe obesity without serious comorbidity with body mass index (BMI) of 60.0 to 69.9 in adult, unspecified obesity type (HCC) Updating labs today. BP is elevated today. States she doesn't monitor at home. Nurse visit in 2 weeks to recheck.  - CBC with  Differential/Platelet - Comprehensive metabolic panel - Hemoglobin A1c - TSH - Lipid panel  6. Anxiety and depression Refilling trazodone. Adding hydroxyzine. Reassess at next PCP visit in February. - hydrOXYzine (VISTARIL) 25 MG capsule; Take 1 capsule (25 mg total) by mouth every 8 (eight) hours as needed.  Dispense: 30 capsule; Refill: 0 - traZODone (DESYREL) 50 MG tablet; Take 1/2 - 2 tablets by mouth at bedtime. NO REFILLS. NEEDS TO TRANSITION CARE.  Dispense: 90 tablet; Refill: 0    Patient aware of signs/symptoms requiring further/urgent evaluation.  Follow-up: Return in about 2 weeks (around 08/12/2021) for nurse visit BP check.    Clayborne Dana, NP  I spent >32 minutes dedicated to the care of this patient on the date of this encounter to include pre-visit chart review of prior notes and results, face-to-face time with the patient, and post-visit ordering of testing as indicated.

## 2021-07-30 ENCOUNTER — Encounter: Payer: Self-pay | Admitting: Family Medicine

## 2021-07-30 ENCOUNTER — Other Ambulatory Visit: Payer: Self-pay

## 2021-07-30 DIAGNOSIS — F418 Other specified anxiety disorders: Secondary | ICD-10-CM

## 2021-07-30 DIAGNOSIS — F3342 Major depressive disorder, recurrent, in full remission: Secondary | ICD-10-CM

## 2021-07-30 MED ORDER — ESCITALOPRAM OXALATE 20 MG PO TABS: ORAL_TABLET | ORAL | 0 refills | Status: AC

## 2021-07-30 NOTE — Telephone Encounter (Signed)
Monique Austin states she did not receive the Lexapro refill.

## 2021-08-12 ENCOUNTER — Other Ambulatory Visit: Payer: Self-pay | Admitting: Family Medicine

## 2021-08-12 ENCOUNTER — Ambulatory Visit: Payer: BC Managed Care – PPO

## 2021-08-12 DIAGNOSIS — F419 Anxiety disorder, unspecified: Secondary | ICD-10-CM

## 2021-08-12 DIAGNOSIS — F32A Depression, unspecified: Secondary | ICD-10-CM

## 2021-08-30 DIAGNOSIS — I10 Essential (primary) hypertension: Secondary | ICD-10-CM | POA: Diagnosis not present

## 2021-08-30 DIAGNOSIS — F330 Major depressive disorder, recurrent, mild: Secondary | ICD-10-CM | POA: Diagnosis not present

## 2021-08-30 DIAGNOSIS — E785 Hyperlipidemia, unspecified: Secondary | ICD-10-CM | POA: Diagnosis not present

## 2021-08-30 DIAGNOSIS — L309 Dermatitis, unspecified: Secondary | ICD-10-CM | POA: Diagnosis not present

## 2021-08-30 DIAGNOSIS — E039 Hypothyroidism, unspecified: Secondary | ICD-10-CM | POA: Diagnosis not present

## 2021-09-16 ENCOUNTER — Ambulatory Visit: Payer: BC Managed Care – PPO | Admitting: Medical-Surgical

## 2021-09-26 DIAGNOSIS — F439 Reaction to severe stress, unspecified: Secondary | ICD-10-CM | POA: Diagnosis not present

## 2021-09-26 DIAGNOSIS — I10 Essential (primary) hypertension: Secondary | ICD-10-CM | POA: Diagnosis not present

## 2021-09-26 DIAGNOSIS — Z13228 Encounter for screening for other metabolic disorders: Secondary | ICD-10-CM | POA: Diagnosis not present

## 2021-09-26 DIAGNOSIS — Z6841 Body Mass Index (BMI) 40.0 and over, adult: Secondary | ICD-10-CM | POA: Diagnosis not present

## 2021-10-08 DIAGNOSIS — Z1231 Encounter for screening mammogram for malignant neoplasm of breast: Secondary | ICD-10-CM | POA: Diagnosis not present

## 2021-10-08 DIAGNOSIS — I10 Essential (primary) hypertension: Secondary | ICD-10-CM | POA: Diagnosis not present

## 2021-10-08 DIAGNOSIS — E039 Hypothyroidism, unspecified: Secondary | ICD-10-CM | POA: Diagnosis not present

## 2021-10-09 DIAGNOSIS — I10 Essential (primary) hypertension: Secondary | ICD-10-CM | POA: Diagnosis not present

## 2021-10-09 DIAGNOSIS — Z713 Dietary counseling and surveillance: Secondary | ICD-10-CM | POA: Diagnosis not present

## 2021-10-09 DIAGNOSIS — E782 Mixed hyperlipidemia: Secondary | ICD-10-CM | POA: Diagnosis not present

## 2021-10-23 DIAGNOSIS — I10 Essential (primary) hypertension: Secondary | ICD-10-CM | POA: Diagnosis not present

## 2021-10-23 DIAGNOSIS — Z713 Dietary counseling and surveillance: Secondary | ICD-10-CM | POA: Diagnosis not present

## 2021-10-23 DIAGNOSIS — E782 Mixed hyperlipidemia: Secondary | ICD-10-CM | POA: Diagnosis not present

## 2021-11-01 DIAGNOSIS — Z6841 Body Mass Index (BMI) 40.0 and over, adult: Secondary | ICD-10-CM | POA: Diagnosis not present

## 2021-11-01 DIAGNOSIS — E039 Hypothyroidism, unspecified: Secondary | ICD-10-CM | POA: Diagnosis not present

## 2021-11-05 DIAGNOSIS — Z1231 Encounter for screening mammogram for malignant neoplasm of breast: Secondary | ICD-10-CM | POA: Diagnosis not present

## 2021-11-20 DIAGNOSIS — Z713 Dietary counseling and surveillance: Secondary | ICD-10-CM | POA: Diagnosis not present

## 2021-11-20 DIAGNOSIS — E782 Mixed hyperlipidemia: Secondary | ICD-10-CM | POA: Diagnosis not present

## 2021-11-20 DIAGNOSIS — R7303 Prediabetes: Secondary | ICD-10-CM | POA: Diagnosis not present

## 2021-12-05 DIAGNOSIS — L409 Psoriasis, unspecified: Secondary | ICD-10-CM | POA: Diagnosis not present

## 2021-12-25 DIAGNOSIS — R7303 Prediabetes: Secondary | ICD-10-CM | POA: Diagnosis not present

## 2021-12-25 DIAGNOSIS — Z713 Dietary counseling and surveillance: Secondary | ICD-10-CM | POA: Diagnosis not present

## 2021-12-25 DIAGNOSIS — E782 Mixed hyperlipidemia: Secondary | ICD-10-CM | POA: Diagnosis not present

## 2022-02-03 DIAGNOSIS — Z713 Dietary counseling and surveillance: Secondary | ICD-10-CM | POA: Diagnosis not present

## 2022-02-03 DIAGNOSIS — Z6841 Body Mass Index (BMI) 40.0 and over, adult: Secondary | ICD-10-CM | POA: Diagnosis not present

## 2022-02-03 DIAGNOSIS — R7303 Prediabetes: Secondary | ICD-10-CM | POA: Diagnosis not present

## 2022-02-05 DIAGNOSIS — E039 Hypothyroidism, unspecified: Secondary | ICD-10-CM | POA: Diagnosis not present

## 2022-03-02 ENCOUNTER — Other Ambulatory Visit: Payer: Self-pay | Admitting: Osteopathic Medicine

## 2022-03-02 DIAGNOSIS — E785 Hyperlipidemia, unspecified: Secondary | ICD-10-CM

## 2022-03-14 ENCOUNTER — Other Ambulatory Visit: Payer: Self-pay | Admitting: Osteopathic Medicine

## 2022-04-10 DIAGNOSIS — Z1211 Encounter for screening for malignant neoplasm of colon: Secondary | ICD-10-CM | POA: Diagnosis not present

## 2022-04-10 DIAGNOSIS — Z0000 Encounter for general adult medical examination without abnormal findings: Secondary | ICD-10-CM | POA: Diagnosis not present

## 2022-04-10 DIAGNOSIS — F330 Major depressive disorder, recurrent, mild: Secondary | ICD-10-CM | POA: Diagnosis not present

## 2022-04-10 DIAGNOSIS — I10 Essential (primary) hypertension: Secondary | ICD-10-CM | POA: Diagnosis not present

## 2022-04-10 DIAGNOSIS — E039 Hypothyroidism, unspecified: Secondary | ICD-10-CM | POA: Diagnosis not present

## 2022-05-22 DIAGNOSIS — J069 Acute upper respiratory infection, unspecified: Secondary | ICD-10-CM | POA: Diagnosis not present

## 2022-05-22 DIAGNOSIS — I10 Essential (primary) hypertension: Secondary | ICD-10-CM | POA: Diagnosis not present
